# Patient Record
Sex: Male | Born: 1960 | Race: White | Hispanic: No | Smoking: Never smoker
Health system: Southern US, Community
[De-identification: ages and names within clinical notes are randomized; demographics above are authoritative.]

## PROBLEM LIST (undated history)

## (undated) DIAGNOSIS — I10 Essential (primary) hypertension: Secondary | ICD-10-CM

## (undated) HISTORY — PX: BACK SURGERY: SHX140

## (undated) HISTORY — PX: OTHER SURGICAL HISTORY: SHX169

---

## 2002-06-06 ENCOUNTER — Encounter: Payer: Self-pay | Admitting: Specialist

## 2002-06-10 ENCOUNTER — Ambulatory Visit (HOSPITAL_COMMUNITY): Admission: RE | Admit: 2002-06-10 | Discharge: 2002-06-10 | Payer: Self-pay | Admitting: Specialist

## 2002-07-11 ENCOUNTER — Encounter (INDEPENDENT_AMBULATORY_CARE_PROVIDER_SITE_OTHER): Payer: Self-pay | Admitting: *Deleted

## 2002-07-11 ENCOUNTER — Ambulatory Visit (HOSPITAL_COMMUNITY): Admission: RE | Admit: 2002-07-11 | Discharge: 2002-07-12 | Payer: Self-pay | Admitting: Specialist

## 2002-07-11 ENCOUNTER — Encounter: Payer: Self-pay | Admitting: Specialist

## 2002-07-16 ENCOUNTER — Emergency Department (HOSPITAL_COMMUNITY): Admission: EM | Admit: 2002-07-16 | Discharge: 2002-07-16 | Payer: Self-pay | Admitting: Emergency Medicine

## 2002-11-07 ENCOUNTER — Encounter: Payer: Self-pay | Admitting: Specialist

## 2002-11-07 ENCOUNTER — Encounter: Admission: RE | Admit: 2002-11-07 | Discharge: 2002-11-07 | Payer: Self-pay | Admitting: Specialist

## 2003-02-12 ENCOUNTER — Inpatient Hospital Stay (HOSPITAL_COMMUNITY): Admission: RE | Admit: 2003-02-12 | Discharge: 2003-02-16 | Payer: Self-pay | Admitting: Specialist

## 2003-02-12 ENCOUNTER — Encounter: Payer: Self-pay | Admitting: Specialist

## 2006-11-02 ENCOUNTER — Encounter: Admission: RE | Admit: 2006-11-02 | Discharge: 2006-11-02 | Payer: Self-pay | Admitting: Specialist

## 2007-01-16 ENCOUNTER — Observation Stay (HOSPITAL_COMMUNITY): Admission: RE | Admit: 2007-01-16 | Discharge: 2007-01-18 | Payer: Self-pay | Admitting: Orthopaedic Surgery

## 2007-05-08 ENCOUNTER — Encounter: Admission: RE | Admit: 2007-05-08 | Discharge: 2007-05-08 | Payer: Self-pay | Admitting: Specialist

## 2007-07-16 ENCOUNTER — Inpatient Hospital Stay (HOSPITAL_COMMUNITY): Admission: RE | Admit: 2007-07-16 | Discharge: 2007-07-19 | Payer: Self-pay | Admitting: Specialist

## 2010-08-12 ENCOUNTER — Encounter
Admission: RE | Admit: 2010-08-12 | Discharge: 2010-08-12 | Payer: Self-pay | Source: Home / Self Care | Attending: Specialist | Admitting: Specialist

## 2010-08-28 ENCOUNTER — Encounter: Payer: Self-pay | Admitting: Specialist

## 2010-09-16 ENCOUNTER — Other Ambulatory Visit (HOSPITAL_COMMUNITY): Payer: Self-pay | Admitting: Specialist

## 2010-09-16 ENCOUNTER — Ambulatory Visit (HOSPITAL_COMMUNITY)
Admission: RE | Admit: 2010-09-16 | Discharge: 2010-09-16 | Disposition: A | Discharge status: Home / Self Care | Payer: Medicare Other | Source: Ambulatory Visit | Attending: Specialist | Admitting: Specialist

## 2010-09-16 ENCOUNTER — Encounter (HOSPITAL_COMMUNITY)
Admission: RE | Admit: 2010-09-16 | Discharge: 2010-09-16 | Disposition: A | Discharge status: Home / Self Care | Payer: Medicare Other | Source: Ambulatory Visit | Attending: Specialist | Admitting: Specialist

## 2010-09-16 DIAGNOSIS — Z01818 Encounter for other preprocedural examination: Secondary | ICD-10-CM | POA: Insufficient documentation

## 2010-09-16 DIAGNOSIS — I10 Essential (primary) hypertension: Secondary | ICD-10-CM | POA: Insufficient documentation

## 2010-09-16 DIAGNOSIS — IMO0002 Reserved for concepts with insufficient information to code with codable children: Secondary | ICD-10-CM

## 2010-09-16 DIAGNOSIS — M5137 Other intervertebral disc degeneration, lumbosacral region: Secondary | ICD-10-CM | POA: Insufficient documentation

## 2010-09-16 DIAGNOSIS — M51379 Other intervertebral disc degeneration, lumbosacral region without mention of lumbar back pain or lower extremity pain: Secondary | ICD-10-CM | POA: Insufficient documentation

## 2010-09-16 LAB — COMPREHENSIVE METABOLIC PANEL
ALT: 32 U/L (ref 0–53)
Albumin: 4.1 g/dL (ref 3.5–5.2)
BUN: 17 mg/dL (ref 6–23)
CO2: 26 mEq/L (ref 19–32)
Chloride: 96 mEq/L (ref 96–112)
Creatinine, Ser: 1.26 mg/dL (ref 0.4–1.5)
GFR calc Af Amer: >60 mL/min (ref >60)
GFR calc non Af Amer: >60 mL/min (ref >60)
Glucose, Bld: 100 mg/dL — ABNORMAL HIGH (ref 70–99)
Sodium: 128 mEq/L — ABNORMAL LOW (ref 135–145)
Total Protein: 7.1 g/dL (ref 6.0–8.3)

## 2010-09-16 LAB — SURGICAL PCR SCREEN: Staphylococcus aureus: NEGATIVE

## 2010-09-16 LAB — DIFFERENTIAL
Basophils Relative: 1 % (ref 0–1)
Lymphocytes Relative: 19 % (ref 12–46)
Lymphs Abs: 1.8 10*3/uL (ref 0.7–4.0)
Neutrophils Relative %: 70 % (ref 43–77)

## 2010-09-16 LAB — TYPE AND SCREEN
ABO/RH(D): B POS
Antibody Screen: NEGATIVE

## 2010-09-16 LAB — PROTIME-INR: Prothrombin Time: 13.2 seconds (ref 11.6–15.2)

## 2010-09-16 LAB — CBC: HCT: 45.8 % (ref 39.0–52.0)

## 2010-09-16 LAB — URINALYSIS, ROUTINE W REFLEX MICROSCOPIC
Hgb urine dipstick: NEGATIVE
Ketones, ur: NEGATIVE mg/dL
Nitrite: NEGATIVE
Protein, ur: NEGATIVE mg/dL
Urine Glucose, Fasting: NEGATIVE mg/dL

## 2010-09-16 LAB — ABO/RH: ABO/RH(D): B POS

## 2010-09-20 ENCOUNTER — Inpatient Hospital Stay (HOSPITAL_COMMUNITY): Payer: Medicare Other

## 2010-09-20 ENCOUNTER — Inpatient Hospital Stay (HOSPITAL_COMMUNITY)
Admission: RE | Admit: 2010-09-20 | Discharge: 2010-09-25 | DRG: 460 | Disposition: A | Discharge status: Home Health | Payer: Medicare Other | Source: Ambulatory Visit | Attending: Specialist | Admitting: Specialist

## 2010-09-20 DIAGNOSIS — I10 Essential (primary) hypertension: Secondary | ICD-10-CM | POA: Diagnosis present

## 2010-09-20 DIAGNOSIS — Q762 Congenital spondylolisthesis: Secondary | ICD-10-CM

## 2010-09-20 DIAGNOSIS — Y9210 Unspecified residential institution as the place of occurrence of the external cause: Secondary | ICD-10-CM | POA: Diagnosis not present

## 2010-09-20 DIAGNOSIS — K219 Gastro-esophageal reflux disease without esophagitis: Secondary | ICD-10-CM | POA: Diagnosis present

## 2010-09-20 DIAGNOSIS — Z23 Encounter for immunization: Secondary | ICD-10-CM

## 2010-09-20 DIAGNOSIS — E871 Hypo-osmolality and hyponatremia: Secondary | ICD-10-CM | POA: Diagnosis present

## 2010-09-20 DIAGNOSIS — Z981 Arthrodesis status: Secondary | ICD-10-CM

## 2010-09-20 DIAGNOSIS — M5137 Other intervertebral disc degeneration, lumbosacral region: Principal | ICD-10-CM | POA: Diagnosis present

## 2010-09-20 DIAGNOSIS — G9741 Accidental puncture or laceration of dura during a procedure: Secondary | ICD-10-CM | POA: Diagnosis not present

## 2010-09-20 DIAGNOSIS — Z79899 Other long term (current) drug therapy: Secondary | ICD-10-CM

## 2010-09-20 DIAGNOSIS — M51379 Other intervertebral disc degeneration, lumbosacral region without mention of lumbar back pain or lower extremity pain: Principal | ICD-10-CM | POA: Diagnosis present

## 2010-09-20 DIAGNOSIS — M5126 Other intervertebral disc displacement, lumbar region: Secondary | ICD-10-CM | POA: Diagnosis present

## 2010-09-20 DIAGNOSIS — IMO0002 Reserved for concepts with insufficient information to code with codable children: Secondary | ICD-10-CM | POA: Diagnosis not present

## 2010-09-21 LAB — BASIC METABOLIC PANEL
Creatinine, Ser: 1.26 mg/dL (ref 0.4–1.5)
GFR calc non Af Amer: >60 mL/min (ref >60)
Glucose, Bld: 115 mg/dL — ABNORMAL HIGH (ref 70–99)
Potassium: 4.6 mEq/L (ref 3.5–5.1)

## 2010-09-21 LAB — HEMOGLOBIN AND HEMATOCRIT, BLOOD
HCT: 35.9 % — ABNORMAL LOW (ref 39.0–52.0)
Hemoglobin: 12.4 g/dL — ABNORMAL LOW (ref 13.0–17.0)

## 2010-09-22 LAB — BASIC METABOLIC PANEL
CO2: 27 mEq/L (ref 19–32)
Chloride: 99 mEq/L (ref 96–112)
GFR calc non Af Amer: >60 mL/min (ref >60)
Glucose, Bld: 103 mg/dL — ABNORMAL HIGH (ref 70–99)

## 2010-09-22 LAB — POCT I-STAT 4, (NA,K, GLUC, HGB,HCT)
Hemoglobin: 13.9 g/dL (ref 13.0–17.0)
Sodium: 129 mEq/L — ABNORMAL LOW (ref 135–145)

## 2010-10-04 NOTE — Op Note (Signed)
[name], [name]                 ACCOUNT NO.:  [id number]  MEDICAL RECORD NO.:  [id number]           PATIENT TYPE:  O  LOCATION:  XRAY                         FACILITY:  [facility]  PHYSICIAN:  Kerrin Champagne, M.D.   DATE OF BIRTH:  1961/05/07  DATE OF PROCEDURE:  09/20/2010 DATE OF DISCHARGE:  09/16/2010                              OPERATIVE REPORT   PREOPERATIVE DIAGNOSIS:  Central herniated nucleus pulposus, L3-4 with degenerative disk disease at L3-4 above previous L4-S1 fusion.  POSTOPERATIVE DIAGNOSES: 1. 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 2. Degenerative disk disease at bilateral L3 nerve roots and bilateral     L4 nerve roots. 3. Dural tear, right L3-4.  This is at the midline, to the right of     midline, 3 mm x 3 mm.  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  PROCEDURES: 1. Central laminectomy, L3-4. 2. Bilateral L3 and bilateral L4 nerve root decompression and     foraminotomies. 3. Right-sided L3-4 dural repair using 4-0 Nurolon sutures and     DuraSeal. 4. Right transforaminal lumbar interbody fusion utilizing a 9-mm     Concorde lordotic cage with local bone graft. 5. Posterior lateral fusion, L3-4, utilizing local bone graft. 6. Internal fixation, L3-4, utilizing the DePuy polyaxial screws and     rods and pedicle screws and rods.  This was at the L4 level.  SURGEON:  Kerrin Champagne, M.D.  ASSISTANT:  Maud Deed, PA-C.  ANESTHESIA:  General via orotracheal intubation, Dr. Chaney Malling. Supplemented with local infiltration with Marcaine 0.5% with 1:200,000 epinephrine, 20 mL.  FINDINGS:  Significant for severe foraminal stenosis, bilateral L3 nerve roots and bilateral L4 nerve roots due to hypertrophic ligamentum flavum pressing against these nerves.  There was scar from previous lumbar surgeries, L4 sacrum; previous lumbar fusions and central laminectomy. The patient had very large epidural veins present.  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  This too contributed to scar tissue over the area of the pars mid-laminar site.  SPECIMENS:  None.  ESTIMATED BLOOD LOSS:  550 mL.  COMPLICATIONS:  Dural tear, right side L3-4, 3 mm x 3 mm tear.  DRAINS:  Foley to straight drain.  Subcutaneous Hemovac, right lumbar.  BRIEF CLINICAL HISTORY:  The patient is a 50 year old male.  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  He persisted with pain and discomfort and underwent a revision to a lumbar fusion from L4 to sacrum in 2004, since then, he has done fine in regards to his lumbar spine.  Developed symptoms and signs consistent with cervical stenosis.  He underwent an open trapdoor laminoplasty, cervical spine, from C3-C7 in 2008 and tolerated this procedure well.  He returned to the operating room 2 years ago, underwent removal of hardware at the L4 sacral level for a solid fusion.  He had fracture to the sacral screws at the S1 level. Intraoperatively, these screws were removed as well as the broken portions  of the S1 screws.  Examination of the patient's fusion showed it to be solid and well healed.  The patient has been developing progressive and increasing difficulties with standing and ambulation and pain into both lower extremities.  He has undergone extensive evaluation, and his studies have demonstrated the disk protrusion at L3- 4 above his previous L4 to sacrum fusion, degenerative disk changes and mild stenosis.  Persistent pain and discomfort, use of strong narcotic medication, so it was felt that the patient was a candidate to undergo intervention to diminish his discomfort and improve his overall function.  He was brought to the operating room to undergo decompression at the L4-5 level with extension of fusion from the 4 to S1 level up to the L3-4 level.  Intraoperative findings as  above.  DESCRIPTION OF PROCEDURE:  Note, the patient, prior to general anesthesia, was in the preoperative holding area and signed informed consent.  All questions were answered.  Received standard preoperative antibiotic of Ancef.  He was taken to the operating room.  OR room 15 was used for the case.  Here, he underwent induction of general anesthesia.  A Foley catheter was placed.  He was then turned to a prone position, and the [place] spine frame was used.  All pressure points well padded.  Care taken to ensure that the abdomen, chest, and the chest base were well padded as well.  PAS hose was used.  Standard prep with DuraPrep solution, draped in the usual manner, iodine Y-drape was used.  Standard time-out protocol identifying the patient, procedure, expected length of time, and estimated blood loss.  Cell Saver was used during the case.  The patient had initial incision made with extension of the incision superiorly to the L1 and L2 levels, ellipsing the old incision scar over the upper aspect of L4 and L5.  Incision carried through skin and subcutaneous layers directly down to the spinous processes of L2, L3, and L1.  Incision carried on both sides of the spinous process of L1, L2, and L3 and along the posterior elements. Cobb elevators used to elevate muscles bilaterally.  Kocher clamp placed on either side of spinous process of L3 and intraoperative radiograph obtained using C-arm fluoro, this demonstrated lower clamp on the L3 spinous process and the upper clamp at the L2-3 interspinous ligament posteriorly.  Continued exposure was then performed, exposing posterior and lateral fusion areas at the L5 and L4 levels bilaterally using curved osteotome as well as Cobb elevators and over the posterior aspect of the facet at the L3-4 and L4-5 levels, preserving the facet capsule at the L2-3 level and at the L1-2 level.  Obtaining enough lateral exposure to allow for insertion of  pedicle screws at the correct degree of convergence.  Bleeders controlled using monopolar and bipolar electrocautery.  The transverse process at the level of the L2-3 facet joint, which represented the L3 transverse process was localized on both sides and exposed as was the posterolateral fusion mass at the transverse process of L4 bilaterally.  Bleeders again controlled using bipolar and monopolar electrocautery.  Posterolateral gutters were then carefully packed and Leksell rongeur then used to remove the spinous process of L3, then the central portions in the lamina of L3.  Anterior aspect of the spinous process of L2, about 50% was resected as well. Posterior aspect of the interlaminar space was carefully debrided of muscle attachments and bleeding controlled using bipolar electrocautery again.  Loupe magnification headlamp used during this portion of  procedure and osteotome then used to resect the medial aspect of the L3- 4 facet, approximately 50% was resected, first on the left side, then on the right side.  Osteotome used similarly in the central portions of the lamina of L3.  Kerrison then introduced to resect central portions of lamina.  Cottonoid placed beneath the neural arch during resection centrally, and this was continued cranially to the L2-3 level.  At this point, osteotomes were then used to resect portions of the medial aspect of the L3 superior articular process and portion of the superior aspect of the lamina at this level, performing lateral recess decompression at the L2-3 level and resecting ligamentum flavum at the L2-3 level.  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 Thickened ligament was carefully freed up on the right side, and a 3-mm Kerrison was used to first free the ligamentum flavum and curette, then applied to the medial aspect of the L3-4 facet.  This was when the dural tear  occurred.  There were elements found to be herniated through the dural tear, and these were carefully kept intact.  Cottonoids applied.  Irrigation performed.  An osteotome was used to further resect bone, then along the medial aspect of the L3-4 facet in order to decompress and remove another facet and ligamentum flavum so we were lateral to the dural tear.  Foramen of the L4 nerve root was carefully decompressed using 3 mm and 2 mm Kerrison, so much of the hockey stick neural probe could then be passed out the L4 neural foramen on the right side.  I passed along the medial aspect of the L4 pedicle and the neural foramen for the L3 nerve root identified. A hockey stick neural probe passed out this neural foramen, and the retracted portions of ligamentum flavum found to be quite hypertrophic were resected protecting the nerve root with the hockey stick neural probe.  This was continued superiorly.  The patient was found to have evidence of bilateral spondylolysis at the three level present.  The mobile inferior articular process of L3 was resected, first on the right side, then on the left side.  With the reflected portions of ligamentum flavum and medial aspect of L3-4 facet then resected, the dural tear was carefully evaluated.  Some significant bleeding from epidural veins along the right side, along the lateral aspect of the L4 nerve root as it passed the medial border of the L4 pedicle and also along the axillary area of the L3 nerve root.  Careful evaluation of the dural tear on the right side demonstrated a flap-type tear with base caudal, measuring about 3 x 3 mm.  A stay suture was then first placed in the central base of the flap, and then this was used for tethering of the thecal sac over the left side of the tear and caudal aspect.  A second suture was then placed distal, tagging the flap distally as well and then allowing for the nerve roots to carefully relax into the thecal  sac area and this was without difficulty.  Next, additional 4-0 Nurolon tacking sutures were then placed circumferentially about the flap, first over the left side, then over the right side, tacking this down at 0.5 mm increments or less.  With this then, on Valsalva, there was no sign of dural leak at this point.  Attention was then turned to further decompression on the left side of the ligamentum flavum.  The  medial aspect of the L3-4 facet was carefully resected again, patient noted to have mobile inferior articular process at L3 secondary to spondylosis, so this was resected. Osteotome then used to resect medial 20% to 25% of the superior articular process of L4, and then, this was removed using 2-mm Kerrisons.  This allowed for resection also of the ligamentum flavum attached here.  The medial aspect of the superior articular process of the L4 was undercut using 3-mm Kerrisons.  Foraminotomy performed of the left L3 nerve root, first identifying the nerve root at the 2-3 level and resecting over the nerve root out into the L3 neural foramen.  With this done, a hockey stick neural probe could be passed out the L4 and L3 neural foramens.  Some residual portions of the medial aspect of the L4 superior articular process were found to be present and compressing on the L4 root along with ligamentum flavum at the entry point to its neural foramen, this was resected.  The hockey stick neural probe, as noted, could be passed out over the L4 and L3 neural foramen.  Bleeding controlled using bipolar electrocautery as well as thrombin-soaked Gelfoam and cottonoids on the left side.  Attention was then turned to the performance of stabilization, and C-arm fluoro was brought until the awl was placed over the lateral aspect of the pedicle at L3, at the intersection of the transverse process with the lateral aspect of the pedicle.  Awl noted on C-arm fluoro to be in excellent position alignment.   A pedicle probe used to probe the pedicle on the left side to a depth of 40 mm, observed on C-arm fluoro to be in good position alignment.  Ball-tipped probe then used to check the patency of the opening into the pedicle, ensuring no broaching cortex.  The patient then had tapping performed, using 6.0 tap.  A 7 mm x 40 mm screw was chosen.  Decortication of the transverse process carried out, again checking the tap opening into the pedicle and ensure no broaching of cortex, and the screw was inserted, screwed into place after first placing bone graft that had been harvested locally from the central laminectomy over the left side following decortication of the transverse process of L3.  Awl was then placed on the left side at the L3 level and identifying the pedicle within the laminotomy region.  This then directed with convergence.  The awl observed on C-arm fluoro to be in good position alignment. Pedicle probe then used to probe the pedicle at the L4 level to a depth of 40 mm, and a 40 mm x 7 mm screw was chosen and tapped with a 6 mm tap, checking with a ball-tipped probe to ensure no broaching of cortex with either the initial pedicle probing or the second tapping.  Decortication of transverse process with a high-speed bur then carried out.  Bone graft applied to the transverse process of left side, extending from the transverse process of L4 and the posterolateral fusion mass up to the L3 level.  Both screws and fasteners carefully loosened.  A 45-mm rod then placed on the left side. Caps were temporarily placed and left loosened for later tightening. Attention then turned to the right side where again awl used to make an entry point on the right side at the L3 level at the intersection of transverse process with the lateral aspect of the pedicle at L3.  This was done without difficulty.  Observed on C-arm fluoro to be  in good position alignment and a pedicle probe then used to probe  pedicle at L3 on the right side.  Venous bleeding encountered.  Bleeders controlled using bipolar and monopolar electrocautery.  The patient had tapping performed and checking with a ball-tipped probe noted some lateral penetration of the lateral cortex at the L3 level, so the screw was redirected using first the pedicle probe to reinsert and determine the alignment of the pedicle.  Then, a ball-tipped probe in order to probe the pedicle to ensure patency without broaching of cortex.  Tapping performed using a 6.0-mm tap on the right side, and ball-tipped probe again used to probe the pedicle channel to determine the correct degree of convergence.  A 40 mm x 7 mm screw was placed at the L3 level on the right side after decortication of the transverse process and placement of local bone graft that had been morselized here.  Attention was then turned to the right side at the L4 level where similarly an awl used to identify the pedicle on the C-arm fluoro, correct degree of convergence, and a pedicle probe then used to probe pedicle at the L4 level and correct degree of convergence to about 40 mm.  A 45 mm screw was chosen as the previous screw on the opposite side was felt to be slightly short.  Following tapping, checked with a ball-tipped probe and no evidence of broaching of cortex or pedicle at the L4 level.  A 6-mm tap was used to tap, again checking with the ball-tipped probe to ensure no broaching of cortex, none was present.  Decortication carried out of posterolateral fusion mass at the L4 level.  Additional bone graft applied from this mass to the transverse process of L3.  A 45 mm x 7 mm screw was then placed on the right side at the L5 level and obtained excellent purchase.  Both fasteners at the L3 and L4 level on the right side were then carefully loosened.  A 45 mm rod was inserted and temporary fixation caps placed and they were left loosened.  Attention then turned to TLIF  on the right side.  Thecal sac was carefully retracted medially to the L3-4 level.  Bipolar electrocautery used to control venous bleeding in the axillary portion of the L3 nerve root and out into the foramen of the L3 nerve root.  This was completed and hemostasis was obtained.  A 15-blade scalpel used to incise disk on the right side just over the superior aspect of the pedicle at L4.  A window of annulus was resected.  Pituitary rongeurs used to resect bone superficially, and then pituitary rongeurs used to debride intervertebral disk space of degenerative disk material.  A spacer was then inserted to dilate the disk space, first to 8 mm, then 9 and 10 mm. Straight and upbiting pituitaries were then used to further debride the disk space of degenerative disk material, and then curettes were able to be introduced.  Straight curette and right-upbiting left curettes were used to debride the intervertebral disk space of degenerative disk material and cartilaginous endplates down to bleeding bone endplates. Pituitary rongeurs used to further debride the disk space, and ring curettes were used to further debride.  An osteotome was introduced at one point to debride some cartilage debris off the inferior aspect of the inferior endplate of L3.  With this then, the disk space was felt to be adequately debrided of degenerative disk material as well as cartilaginous endplates with the  inferior aspect of L3 and superior aspect of L4.  Sounding was carried out, using first 8 and then 9 mm sounder; 9 mm provided excellent fit and observed on C-arm fluoro to be in good position alignment, filling the disk space adequately with no space superiorly or inferiorly.  A 5-mm lordotic Concorde cage was chosen.  Morselized bone graft was packed within the lordotic Concorde cage at 9 mm using a short 23-mm implant.  Additional morselized bone graft material was placed within the intervertebral disk  space, impacting the graft as I went along at least three times in order to impact the disk space with morselized bone graft.  The 23-mm x 9-mm cage packed with bone grafts was then introduced at about 15 degrees convergence and then impacted into the disk space and subset beneath the posterior aspect of disk space by about 3 or 4 mm, observed on C-arm fluoro to be in good position alignment.  Irrigation was carried out. On careful inspection of the spinal canal, small morselized crevice bone noted to become loose within the canal was carefully removed; and a hockey stick neural probe was then passed out the neural foramen for the L3 and L4 nerve roots bilaterally, demonstrating their persistent patency.  At this point then, DuraSeal was applied to the posterior aspect of the thecal sac on the right side after careful drying of the area.  Additional 4-0 Nurolon suture was necessary over the distal portion of the flap to ensure adequate seal at the end of the case. Valsalva again performed.  No active dural leak was noted.  The DuraSeal was applied, providing further watertight closure.  At this point then, the rods were then attached to the fasteners and the fastener caps tightened down to 80 foot pounds, first on the left side at the L3 level, then on the right side at the L3 level, then right side L4, compressing between the L3 and L4 fasteners, then turning to the left side and compressing between the L3 and L4 fasteners and tightening the L4 fastener to the rod at 80 foot pounds.  This obtained excellent fixation.  Permanent C-arm images obtained in AP and lateral planes, documenting fixation as well as 45-degree obliques assessing the pedicle screws each and determining them to be well placed.  The Viper retractors were then removed.  Careful inspection demonstrated no significant active bleeding present.  Small amount of devitalized paralumbar muscle was debrided centrally, and the  paralumbar muscles were approximated loosely with interrupted #1 Vicryl sutures. Lumbodorsal fascia reapproximated to itself, and the caudal aspect of the incision and superior aspect of the incision were reattached to the superior and interspinous ligaments, extending from L1-L2 at the midline.  Deep subcu layers approximated with interrupted #1 Vicryls, more superficial layers with interrupted 1-0 and 2-0 Vicryl sutures, and skin closed with running subcu stitch of 4-0 Vicryl.  The patient then had Octylseal applied to the skin and then Mepilex bandage.  Note that a medium Hemovac drain was placed in the superficial aspect of the skin superficial to the fascial layer.  The patient then was returned to supine position, reactivated, extubated, and returned to the recovery room in satisfactory condition.  All instrument and sponge counts were correct.  COMPLICATIONS:  The patient had dural tear, right side at the L3-4 level, requiring a suture repair and DuraSeal.  Physician assistant's responsibility:  Maud Deed performed the duties of assistant surgeon during this case.  She performed surgery from the beginning  of the case to the end of the case, was present for positioning of the patient at the end of the case, with application of dressings, and removal of the patient from the table.  She, during the operation, performed careful retraction of neural structures and careful suctioning about the neural structures, operating with loupe magnification and headlamp.  She assisted in the placement of rods and caps for the screws and pedicle screws and rods.  She assisted with tightening of the posterior structures and the compression of the pedicle screws posteriorly to obtain lordosis.  At the end of the case, she applied dressing following closure.  She performed closure from the fascial layer to the skin.     Kerrin Champagne, M.D.     JEN/MEDQ  D:  09/20/2010  T:  09/21/2010   Job:  [id number]  Electronically Signed by Vira Browns M.D. on 10/04/2010 09:22:15 AM

## 2010-10-26 NOTE — Discharge Summary (Signed)
[name], VASEK                 ACCOUNT NO.:  [id number]  MEDICAL RECORD NO.:  [id number]           PATIENT TYPE:  I  LOCATION:  5018                         FACILITY:  [facility]  PHYSICIAN:  Kerrin Champagne, M.D.   DATE OF BIRTH:  01-Jun-1961  DATE OF ADMISSION:  09/20/2010 DATE OF DISCHARGE:  09/25/2010                              DISCHARGE SUMMARY   ADMISSION DIAGNOSES: 1. Herniated nucleus pulposus central at L3-4. 2. Degenerative disk disease L3-4 above lumbar fusion L4 through S1. 3. Hypertension 4. Gastroesophageal reflux disease.  DISCHARGE DIAGNOSES: 1. 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 2. Spondylosis of bilateral L3, dural tear right L3-4 midline and to     the right, 3. Herniated nucleus pulposus central at L3-4. 4. Degenerative disk disease L3-4 above lumbar fusion L4 through S1. 5. Hypertension 6. Gastroesophageal reflux disease. 7. Posthemorrhagic anemia.  PROCEDURE:  On September 20, 2010, the patient underwent central laminectomy L3-4 with bilateral L3 and L4 foraminotomies, right L3-4 dural repair, and right transforaminal lumbar interbody fusion with pedicle screw and rod instrumentation and local bone graft.  This was performed by Dr. Otelia Sergeant under general anesthesia, assisted by Maud Deed PA-C.  CONSULTATIONS:  None.  BRIEF HISTORY:  The patient is a 50 year old male who has had multiple procedures to his lumbar spine.  He is status post lumbar fusion at L4 through S1 which was initially done with instrumentation and then required removal of instrumentation.  He has studies indicating that his fusion is solid at L4 through S1.  He now has studies indicating degenerative disk disease as well as herniated nucleus pulposus at L3-4. It was felt he would benefit from surgical intervention for the adjacent level degenerative disk disease.  He was admitted to undergo the procedure as stated above.  BRIEF HOSPITAL COURSE:  The  patient developed a dural tear intraoperatively.  He required bedrest for 3 days.  During that timeframe, the patient was treated with IV analgesics for his discomfort.  His Foley catheter remained for 3 days and he was on Keflex for empiric coverage of infection.  The patient had slow return of his bowel function and required laxatives and suppositories for bowel movement.  This diet was held until his bowel function returned.  He was placed on Protonix as well as Zofran for the nausea.  Eventually, his bowel function returned and he was able to take a regular diet.  The patient developed mild hyponatremia which was corrected with IV fluid adjustments.  The patient eventually was weaned to p.o. analgesics.  He was fitted with an LSO brace by [employer].  Physical therapy was initiated on the third postoperative day as well as occupational therapy.  He used a walker for ambulation.  Neurovascular and motor function of the lower extremities was intact.  He did report relief of his leg symptoms preoperatively.  Occupational therapy assisted with ADLs, and the patient was independent with ADLs prior to discharge.  The patient's Hemovac drain was discontinued from his wound on the first postoperative day and dressing changes were done daily thereafter.  The patient was  noted to have healing of the wound without signs of infection.  The patient developed peroneal discomfort and tenderness.  He was started on a Medrol Dosepak as this was felt to be related to swelling postoperatively and possibly to his dural tear.  He also complained of severe muscle spasms which required Valium for relief.  At discharge, the patient was ambulating 250 feet.  He was able to don and doff his brace without difficulties.  At discharge, he was afebrile, vital signs were stable.  He was taking a regular diet and voiding without difficulty.  PERTINENT LABORATORY VALUES:  On admission, hemoglobin 13.9 with hematocrit  41.1.  Hemoglobin at lowest value of 11.6 and 32.8. Chemistry studies on September 20, 2010, showed sodium at 129.  With adjustments in IV fluids, this eventually came up to 131.  Remaining values were within normal limits with the exception of one elevated potassium of 5.2 which resolved prior to discharge.  PLAN:  The patient was discharged to his home.  He was given instructions to continue ambulating utilizing his brace and a walker. He will keep his incision dry until September 25, 2010, and at that point will be allowed to shower.  Dressing changes will be done daily or as needed.  He will increase his activity slowly and avoid bending, lifting, or twisting.  He will continue on a regular diet.  Office visit with Dr. Otelia Sergeant in 2 weeks from the date of surgery.  MEDICATIONS AT DISCHARGE:  The patient was given prescriptions for Valium 2 mg one every 6 hours as needed for spasm, Maalox suspension, and MiraLax over-the-counter.  He will complete his Medrol Dosepak.  He was also given prescriptions for methocarbamol 500 mg one every 6-8 hours as needed for spasm and Percocet 5/325 one to two every 4-6 hours as needed for pain.  He will continue on his hydrochlorothiazide and lisinopril as taken prior to admission.  The patient will call the office if he has questions or concerns prior to his return office visit.  CONDITION ON DISCHARGE:  Stable.     [name], P.A.   ______________________________ Kerrin Champagne, M.D.    SMV/MEDQ  D:  10/13/2010  T:  10/14/2010  Job:  [id number]  Electronically Signed by Dorna Mai. on 10/21/2010 02:04:27 PM Electronically Signed by Vira Browns M.D. on 10/26/2010 11:20:51 PM

## 2010-12-20 NOTE — Op Note (Signed)
NAMESHAQUIL, ALDANA NO.:  [id number]   MEDICAL RECORD NO.:  [id number]          PATIENT TYPE:  INP   LOCATION:  2899                         FACILITY:  [facility]   PHYSICIAN:  Sharolyn Douglas, M.D.        DATE OF BIRTH:  07-Jul-1961   DATE OF PROCEDURE:  01/16/2007  DATE OF DISCHARGE:                               OPERATIVE REPORT   PREOPERATIVE DIAGNOSIS:  Cervical spondylotic myelopathy.   PROCEDURE:  Cervical lamina plasty C3-C7 with [employer]onic plates placed at  each level.   SURGEON:  Sharolyn Douglas, MD   ASSISTANT:  Aura Fey. Lois Huxley BLOOD LOSS:  400 mL.   COMPLICATIONS:  None.   NEEDLE AND SPONGE COUNT:  Correct.   INDICATIONS:  The patient is a pleasant 50 year old male with  progressive neurologic deficit consistent with myelopathy.  His imaging  studies demonstrate multilevel cervical spinal stenosis with flattening  of the cervical cord at multiple levels.  He now presents for expansile  cervical lamina plasty to  decompress his spinal canal and cord.  Risks, benefits, and alternatives  were reviewed and he has elected to proceed.   PROCEDURE:  After informed consent he was taken to the operating room.  He underwent general endotracheal anesthesia without difficulty and  given prophylactic IV antibiotics.  Neuro monitoring was established in  the form of SSEPs and upper extremity EMGs.  The Mayfield tongs were  placed in the usual sterile fashion.  The patient then carefully turned  prone.  Care was taken to keep his neck in neutral position at all  times.  The Mayfield was then attached to the operating room table using  the attachment arm.  All bony prominences were carefully padded.  Face  and eyes were protected at all times.  Neck was prepped and draped usual  sterile fashion.   A midline incision was made from C3 down to T1.  Dissection was carried  sharply through the nuchal ligament.  Subperiosteal exposure carried out  over the  lateral masses of C3 down to C7.  Intraoperative x-ray was  taken to confirm the levels.  Deep retractors were placed.  Once the  bony landmarks were established a 4 mm high speed bur was used to create  a trough on the left side at the lamina facette junction.  Care was  taken not to completely crack through on the left side.  On the right  side the trough was completed.  There was significant epidural bleeding  and this was controlled using FloSeal and Gelfoam.   We then carefully opened the lamina plasty by applying pressure on the  spinous process and using an angled curette underneath the lamina.  By  gently and sequentially applying pressure the lamina plasty was opened.  We then placed micro lamina plasty plates at each level of C3-C7 which  measured 12 mm.  We placed the small screws using the drill.  The bone  quality was good and the screw purchase was excellent.  We took an  intraoperative x-ray and could visualize  the plate at C3, C4 and C5  which appeared well positioned.  Hemostasis was again achieved using  bipolar FloSeal and Gelfoam.   At the completion of the procedure the epidural bleeding had stopped.  We then placed a deep Hemovac drain and closed the wound in layers using  multiple #1 interrupted Vicryl sutures on the deep fascia, 0 Vicryl and  2-0 Vicryl in the subcutaneous layer, a running 3-0 subcuticular Vicryl  on skin edges and Dermabond.  Sterile dressing was applied.  The patient  was turned supine, extubated without difficulty and transferred to  recovery in stable condition.  There were no deleterious changes in the  SSEPs or EMGs throughout procedure.  It should be noted my assistant Orlin Hilding, PA was present throughout the procedure.  She assisted with the  positioning, the exposure by using the Cobbs and using suction.  She  then assisted me with the lamina plasty by retracting the soft tissues  and providing directed suction.  She assisted with wound  closure.      Sharolyn Douglas, M.D.  Electronically Signed     MC/MEDQ  D:  01/16/2007  T:  01/17/2007  Job:  [id number]

## 2010-12-20 NOTE — Op Note (Signed)
NAMEALIX, STOWERS NO.:  [id number]   MEDICAL RECORD NO.:  [id number]          PATIENT TYPE:  INP   LOCATION:  5028                         FACILITY:  [facility]   PHYSICIAN:  Kerrin Champagne, M.D.   DATE OF BIRTH:  September 23, 1960   DATE OF PROCEDURE:  07/16/2007  DATE OF DISCHARGE:                               OPERATIVE REPORT   PREOPERATIVE DIAGNOSIS:  The patient is a 50 year old male with retained  hardware L4 to S1 broken pedicle screws at the S1 level.  Previous  transforaminal lumbar interbody fusion at both L4-5 and L5-S1.   POSTOPERATIVE DIAGNOSIS:  The patient is a 50 year old male with  retained hardware L4 to S1 broken pedicle screws at the S1 level.  Previous transforaminal lumbar interbody fusion at both L4-5 and L5-S1.   OPERATION PERFORMED:  Removal of DePuy pedicle screws and rods, L4 to  S1, including broken screws bilateral S1.  Optimus bone putty by [employer]  applied to each of the screw removal sites.   SURGEON:  Kerrin Champagne, M.D.   ASSISTANT:  None.   ANESTHESIA:  General via orotracheal intubation, Maren [place], M.D.  supplemented with subcutaneous injection with 0.5% Marcaine with  1:200,000 epinephrine.   FINDINGS:  Broken pedicle screws S1 bilateral difficult to remove.  Screw driver for removal of pedicle screws also stripped during removal  of the hardware making further removal difficult as well.   DISPOSITION OF SPECIMENS:  Pedicle screws and rods were returned to the  patient.   ESTIMATED BLOOD LOSS:  400 mL.   COMPLICATIONS:  The patient's sponge count was poorly performed  intraoperatively.  Incision is widely open and there is no place for  loose sponges to be placed and thorough evaluation of the wound  demonstrated no pockets and no dead space where these sponges could be  missing.  The patient returned to the PACU in good condition.  Intraoperatively the patient did also have a piece of removal of  hardware  equipment break intraoperatively and intraoperative radiographs  demonstrated no sign of very tiny metallic fragment.  It is believed  that the fragment either fell into the room or passed through suction  and was suctioned up into the suction equipment.  Intraoperative  radiographs though did not demonstrate any significant residual metallic  material within the incision or wound.   DESCRIPTION OF PROCEDURE:  After adequate general anesthesia, the  patient had a Foley catheter placed, standard preoperative antibiotics.  He was rolled to a prone position using the [facility] spine table.  All  pressure points well-padded.  PAS stockings applied.  Standard prep with  Duraprep solution from lower dorsal spine to midsacral segments, draped  in the usual manner.  Iodine Vi-drape was used.  The incision after  infiltrating skin and subcu layers down to the spinous process using  0.5% Marcaine with 1:200,000 epinephrine.  Incision ellipsing the old  incision scar extending from L3 to the sacrum.  Incision carried through  the skin and subcutaneous layers down to the lumbodorsal fascia  overlying the spinous processes of  L3 and then L2.  Previous spinous  process L4 and 5 had been resected and care was taken not to enter the  central laminotomy region overlying these areas.  Careful dissection was  then carried out down to the base of the spinous process of L3  bilaterally using Cobb elevators and electrocautery.  Dissection was  then carried out over the lateral aspects over the posterior aspect of  the facet at the L2-3 level and out lateral to the L4 pedicle screw at  the L3 level.  This was L3-4.  The incision was then carried laterally  over each area out dissecting using a 1-1/2 inch curved osteotome  sharply dissecting out to the posterolateral masses and the pedicle  screws at L4, L5 and S1 bilaterally.  This was done without difficulty.  Viper retractor was placed, electrocautery used to  control bleeders.  Each of the patients screws, locking cap on the posterior aspect of the  pedicle screws were then loosened at each segment and then the caps were  each removed.  All six caps delivered without difficulty.   In the process of removing one of the caps, however, the flange on the  removing instrument did break and there was a very small piece of  metallic foreign body that then was produced.  This metallic piece had  broken off a second screw and was clearly visible.  Attempts to remove  it then caused it to flip further into the wound or out of the wound  entirely.  It is believed that suction may very well have removed the  metallic foreign body fragment before it could be further removed.  An  intraoperative radiograph was obtained including radiographs of the  suction equipment which were unable to demonstrate the foreign body  remaining either in the patient or in the suction equipment.  Attempts  at trying to find the material were unsuccessful.  Careful inspection of  the wound and the area where it was felt to be present was unable to  find this.  Irrigation was carried out with copious amounts of irrigant  solution, suctioning the wound completely.   After x-rays were taken, continuation of removal of hardware was  performed after removal of each of the caps then the rods were removed  after debriding the rods using Matt Holmes rongeurs as well as curettage.  The  rods were lifted with Cobb elevator and then removed using rod grippers.   Screws were then began to be removed.  First screw on the left side of  the S1 level was able to be engaged with the screw driver for removal of  the screw and then twisting action this screw was able to be loosened  and removed from the wound at the S1 level without difficulty.  Screw  was then applied to the screw head at the L5 level on the left side.  In  the process of removing the screw, a great deal of squeaking was  present.   It was felt that this represented a tight screw to bone fit.  However, eventually the screw head stripped while trying to remove after  three turns of the screw were able to be performed.  With this, then a  bit of time was spent trying to find a second screw driver which was not  present on the set.  The screw driver which had been used earlier this  day for instrumentation of a separate case on a different case  cart was  not available.  Representative was called and the decision was made to  use the insertion device for the removal of the screws.  This was able  to entered over the top of the screw head and the screw head engaged and  tightened to excellent tightness and then using a vise grip about the  base of the screw inserter, the inserter could be twisted backwards  without unlocking the screw head and causing a loosening of the screw  head insertion device.  With this, the remaining five screws were able  to be removed before a second screw driver was able to be located.  After removal of each of the screws including the two broken posterior  portions of the screw at the S1 level then irrigation was performed.  Examination  of the posterior fusion mass demonstrated to be quite solid  with no motion apparent at L4-5 or L5-S1.  This being the case then  attention was turned to removal of the screws at the S1 segment broken  screws.  The holes for the pedicle screw on the right side was apparent  and a small curette was then used to carefully debride within the hole  down to the tip of the residual remaining deep screw threaded portion.  An overdrill reverse from the [employer] set was then able to be engaged  over the top of the top of the screw after first sizing two previous  screws that were present noting that it could be used to overdrill this.  This was then done to a level of about 6 mm to 7 mm exposing 6 or 7 mm  of the more superficial portion of the screw.  The screw was able  to  then be freed up and loosened.  Attempts were used to use an Easy-out  that has reversed threads to capture the previous threads on the screw  and try to remove it but these had been stripped by prior usage and were  ineffective.  Therefore further freeing up of the screw was carried out  using the curved osteotome provided circumferentially around the screw  loosening it up such that it could be grasped with a tonsil clamp and  then carefully turned and twisted until eventually it could be removed  and this was able to be done without difficulty but did take a great  deal of time in order to eventually remove.  A hole into the pedicle at  the S1 level on the right side measuring about 5 to 6 mm about the screw  with 3 mm on either side was left present.  This amounted to about a 1  cm diameter opening in the pedicle.  On the left side similarly the  screw head was localized using curettage about the posterior aspect of  it and then an overdrill was then placed and overreamer and this was  done to carefully expose portions of posterior aspect of screw and this  was able to be done again and with careful working around the screw  using the curved osteotome again, the screw was eventually able to be  loosened to the point where it could be grasped this time using a needle  driver and then grasping the screw and then twisting and turning until  it was able to be removed.  Examination of the areas where the screws  had been removed demonstrated holes that were present.  None of them  were within the pelvis.  None of them demonstrated any neural elements  or entry into the sacral spinal canal.  Irrigation was carried out over  the posterior aspect of the patient's back.  Optimus [employer]  demineralized bone matrix material was then brought onto the field a  total of 20 mL.  These were used to fill the holes of the sacrum after  careful  irrigation in order to remove any metallic deposits.   Additional bone graft was then applied to each of the lumbar pedicle  screw holes extending from L4 to L5 to the sacrum bilaterally  essentially augmenting previous fusion as well as placing bone graft  within previous pedicle screw holes in case later necessity of  instrumentation would be necessary.  Irrigation was then carried out.  Medium drain placed in the depth of the incision exiting over the right  lower lumbar spine.  The patient's sponge count was noted to be poorly  performed by change of personnel.  They requested a radiograph; however,  the patient's incision was explored and there was no sign of large  sponges that were apparently counted previously but not accounted for.  There was no space within the wound to place such large sponges.  This  completed, then irrigation was carried out with copious amounts of  irrigant solution.  The paralumbar muscles reapproximated midline with  interrupted #1Vicryl sutures.  Lumbodorsal fascia reattached to the  spinous processes at L2, L3 and sacral level using interrupted #1 Vicryl  sutures.  Lumbodorsal fascia reapproximated to itself with interrupted  figure-of-eight simple sutures of #1 Vicryl suture.  Deep subcu layer  was approximated with interrupted #1 0 Vicryl sutures, more superficial  layers with interrupted 2-0 Vicryl sutures.  Skin closed with a running  subcutaneous stitch of 4-0 Vicryl.  Dermabond was then applied.  4 x 4s  fixed to the skin as well as over the drain site using Hypafix tape and  ABD.  Note the pink tape was also used to stabilize the drain at the  skin level.  The patient was then returned to a supine position,  reactivated extubated, returned to recovery room in satisfactory  condition.  All instrument and sponge counts were correct.      Kerrin Champagne, M.D.  Electronically Signed     JEN/MEDQ  D:  07/16/2007  T:  07/17/2007  Job:  [id number]

## 2010-12-23 NOTE — Discharge Summary (Signed)
NAMEZAYDEN, [name]                           ACCOUNT NO.:  [id number]   MEDICAL RECORD NO.:  [id number]                   PATIENT TYPE:  INP   LOCATION:  5025                                 FACILITY:  [facility]   PHYSICIAN:  Kerrin Champagne, M.D.                DATE OF BIRTH:  1961-03-07   DATE OF ADMISSION:  02/12/2003  DATE OF DISCHARGE:  02/16/2003                                 DISCHARGE SUMMARY   ADMISSION DIAGNOSES:  1. 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  2. 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  3. Unilateral spondylolysis at L4-5.  4. Unilateral spondylolysis at L3-4.  5. Status post microdiskectomy right L5-S1 in December of 2003.   DISCHARGE DIAGNOSES:  1. 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  2. 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  3. Unilateral spondylolysis at L4-5.  4. Unilateral spondylolysis at L3-4.  5. Status post microdiskectomy right L5-S1 in December of 2003.  6. Post hemorrhagic anemia, mild, not requiring blood transfusion.  7. Hyponatremia, resolved at discharge.  8. Nausea, resolved at discharge.   PROCEDURE:  On February 14, 2003, the patient underwent central laminectomy and  removal of loose neural arc at the L5 and L4 levels. Decompression of  bilateral L4, L5, and S1 nerve roots.  Right translaminar intralumbar disk  fusion (TLIF) both L4-5 and L5-S1 utilizing [employer] TLIF cages  packed with autogenous right iliac crest bone graft, harvested through a  separate fascial incision. Posterolateral lateral fusion L4 to S1 utilizing  instrumentation from L4 to S1 with Monarch pedicle screws and rods.  Intraoperative pedicle screw monitoring. This was performed by Dr. Otelia Sergeant and  assisted by [name], P.A. under general anesthesia.   CONSULTATIONS:  None.   HISTORY OF PRESENT ILLNESS:  The  patient is a 50 year old white male who  injured his back in the summer of 2003.  He eventually required surgical  intervention for microdiskectomy for disk protrusion at the right L5-S1  level. This was noted to be in an area associated with spondylolisthesis  grade I at the L5-S1 level.  Postoperatively, he had persistent low back  pain with radiation into his lower extremities, primarily related to  standing and walking. Conservative treatment did not give him adequate  management of his pain.  His chronic pain has lead to narcotic medication  use as well as inability to return to a normal lifestyle or his regular  work. 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   HOSPITAL COURSE:  The patient was admitted and underwent the procedure as  stated above under general anesthesia without complications.  Postoperatively, neurovascular motor function of the lower extremities was  noted to be intact. The patient was treated with PCA analgesics initially  and gradually weaned to oral analgesics without difficulty. His Hemovac  drain was discontinued on the first postoperative day without difficulty.  Dressing changes were done daily thereafter and the wound was found to be  healing well. The patient's Foley catheter was discontinued on the first  postoperative day and he was able to void without difficulty.  He received  physical therapy starting on his first postoperative day. He had been fitted  previously with an LSO with leg extension. The physical therapy taught him  donning and doffing of the brace as well as ambulation. He was able to  progress quite well once his pain was under control. The patient developed  hyponatremia at 1:30 on the first postoperative day. He was placed on free   fluid restriction and his hyponatremia improved.  The patient was slow to  have bowel movement, however, after use of laxatives, he was able to have a  normal bowel movement.  HE did develop some nausea on the first  postoperative day, but this was felt to be related to oral analgesics on an  empty stomach. He was monitored for several hours following this and was  able to have regular p.o. intake without further nausea. The patient was  tolerating a regular diet at the time of discharge.  He was afebrile with  vital signs stable and ready for discharge to his home on February 16, 2003.   LABORATORY DATA:  Admission CBC with hemoglobin 13.0, hematocrit 37.2.  Hemoglobin dropped to a lowest value of 10.2 with hematocrit 28.3  postoperatively. The patient did not require blood transfusion.  Preoperatively, the patient had a low sodium at 133.  Prior to discharge,  his sodium was 131.  Urinalysis on admission was negative for urinary tract  infection.  There is no chest x-ray report on the chart at the time of this  dictation and no EKG is available as well.   PLAN:  Arrangements were made for the patient to be discharged to his home.  A rolling walker was made available.  He will be seen by [facility] for spinal fusion protocol which includes physical and occupational  therapy. He will be allowed weightbearing as tolerated and will wear his  brace at all times when he is out of bed. The patient will be able to shower  when his wound is having no drainage. Dressing changes will be done daily.  The patient will continue with fluid restrictions for the next 48 hours. He  is to have no more than 2000 mL of free fluid.  Otherwise, the patient will  continue on a regular diet. He will use over-the-counter stool softeners or  laxatives as needed.  He has been encouraged to use stool softeners daily as long as he is using narcotic analgesics. The patient has been advised to  follow up  with Dr. Otelia Sergeant in 10 to 14 days following surgery and has been  advised to call to arrange the appointment.   DISCHARGE MEDICATIONS:  1. Percocet 5/325 one to two every four to six hours as needed for pain.  2. Robaxin 500 mg one every eight hours as needed for spasm.  3. Trinsicon one p.o. b.i.d.   If he has further questions or concerns prior to his return office visit, he  has been advised to call Dr. Barbaraann Faster office.      [name], P.A.                    Kerrin Champagne, M.D.    SMV/MEDQ  D:  04/15/2003  T:  04/16/2003  Job:  [id number]

## 2010-12-23 NOTE — Op Note (Signed)
[name], [name]                           ACCOUNT NO.:  [id number]   MEDICAL RECORD NO.:  [id number]                   PATIENT TYPE:  EMS   LOCATION:  MAJO                                 FACILITY:  [facility]   PHYSICIAN:  Kerrin Champagne, M.D.                DATE OF BIRTH:  1961-02-02   DATE OF PROCEDURE:  07/17/2002  DATE OF DISCHARGE:                                 OPERATIVE REPORT   PREOPERATIVE DIAGNOSIS:  Herniated nucleus pulposus right L5-S1, grade 1-2  spondylolisthesis L5-S1.   POSTOPERATIVE DIAGNOSIS:  Herniated nucleus pulposus right L5-S1, grade 1-2  spondylolisthesis L5-S1.   OPERATION PERFORMED:  Right-sided microdiskectomy L5-S1.   SURGEON:  Kerrin Champagne, M.D.   ASSISTANT:  [name], P.A.   ANESTHESIA:  General orotracheal anesthesia.   ANESTHESIOLOGIST:  Janetta Hora. Gelene Mink, M.D.   ESTIMATED BLOOD LOSS:  50 cc.   DRAINS:  None.   INDICATIONS FOR PROCEDURE:  The patient is a 50 year old male who sustained  injury while working at a job place.  He has had persistent back pain and  radiation of the right leg since the time of his injury.  He has been  maintained on light duty status since the time of his injury with diminished  symptoms.  Whenever he begins to lift weights greater than 20 to 25 pounds,  pain recurs in his back with radiation to the right leg.  With any  repetitive bending or stooping, again, radiation to the right leg.  The  patient is unable to maintain anything more than a sedentary type lifestyle  with persistent recurring episodes of sciatica in the right lower extremity  and S1 distribution.  He is taking narcotic medication on a constant basis.  He was brought to the operating room to diminish discomfort in the right  lower extremity, diminish the symptoms associated with disk protrusion at  the L5-S1 level causing persistent recurring right S1 radiculopathy.   DESCRIPTION OF PROCEDURE:  After adequate general anesthesia,  patient in  knee chest position on chest rolls, standard preoperative antibiotics.  Standard prep with DuraPrep solution, draped in the usual manner, iodine Vi-  drape.  Two spinal needles placed at the expected L5-S1 level.  An  intraoperative lateral radiograph performed indicating the upper spinal  needle at the L5 spinous process.  The incision made at the upper needle  extending superiorly because of the patient's spondylolisthesis, increase in  lumbosacral angle requiring more upward incision.  Incision approximately 1  to 1-1/2 inch in length through the skin and subcutaneous layers using a 10  blade scalpel after infiltration of Marcaine o0.5% with 1:200,000  epinephrine.  The incision carried down to the lumbodorsal fascia.  Bleeders  controlled using electrocautery.  The lumbodorsal fascia incised along the  right side at the L5-S1 spinous process.  Two Cobbs used to elevate the  paralumbar muscles off  the posterior aspects of the elements of the L5  lamina out to the L5-S1 facet.  A Leksell rongeur was used to debride a  small portion of the inferior aspect of the lamina of the right side of L5.  Then Kerrisons used to debride the ligamentum flavum after releasing its  attachment off the inferior aspect of the lamina of L5 on the right side.  A  complete right hemilaminectomy was performed in order to obtain  visualization of the disk at this particular level.  Due to the patient's  increased lumbosacral angle as well as elongation of this pars, the removal  of the hemi lamina on this right side was necessary.  The patient has known  pars defects.  This was noted during the procedure.  With resection of the  ligamentum flavum then the thecal sac was identified.  The S1 nerve root  retracted medially as well as the thecal sac and the disk examined and found  to be listhesed as felt to be present on plain radiographs.  The disk  protrusion found to be migrating over the posterior  aspect of the inferior  body of L5.  However, at the angle at which this particular patient's  lumbosacral angle was, this required excision of a portion of the lateral  aspect of the disk in order to visualize the disk protrusion below it.  The  operating room microscope was draped and brought into the field under direct  visualization.  Epidural veins were cauterized, retracting the thecal sac  and right S1 nerve root carefully.  With cauterization, adequate  hemostasis  was maintained.  An incision was made in the posterior aspect of the disk on  the right side using  a 15 blade scalpel.  The pituitary was used to excise  the disk in order to be able to obtain a more anterior position of the disk  in the lumbosacral angle.  This allowed [place] to find the disk protrusion  material superior to the disk, however, anterior on the foramen and anterior  on the position of the table.  The disk rupture material was then excised  using pituitary rongeurs.  Examination of the spinal canal demonstrated  there was no further disk material remaining.  It was felt that the disk  material was causing compression upon the right L5 nerve root as it was  exiting out the neural foramen as well as the initial portion of the S1  nerve root.  Following excision of the disk, then irrigation was performed.  There was no significant instability noted at this segment with stressing in  the levels using clamps over the posterior spinous processes as well as  direct pressure.  Irrigation again performed.  Bleeding epidural veins  controlled using bipolar electrocautery.  All thrombin soaked Gelfoam was  then removed at the end of the procedure.  There was no further bleeding  evident.  Soft tissues were then allowed to fall back into place, the  lumbodorsal fascia approximated with interrupted #1 Vicryl sutures.  The  subcu layer was reapproximated with interrupted 0 Vicryl sutures, the more superficial layers with  interrupted 2-0 Vicryl suture and the skin closed  with a running subcu stitch of 4-0 Monocryl.  Tincture of benzoin, Steri-  Strips applied, 4 x 4 fixed to the skin with HypaFix tape.  The patient was  then returned to a supine position, extubated and returned to the recovery  room in satisfactory condition.  Sponge and  instrument counts were correct.   SPECIMENS:  Disk material right L5-S1.   Note that a second intraoperative radiograph was obtained following the  initial radiograph.  This was performed using a clamp over the spinous  process of L5 further identifying this level and a final x-ray obtained with  a hockey stick neural probe out the neural foramen of L5-S1.                                                Kerrin Champagne, M.D.    JEN/MEDQ  D:  07/17/2002  T:  07/17/2002  Job:  [id number]

## 2010-12-23 NOTE — Discharge Summary (Signed)
[name], [name]                 ACCOUNT NO.:  [id number]   MEDICAL RECORD NO.:  [id number]          PATIENT TYPE:  INP   LOCATION:  5028                         FACILITY:  [facility]   PHYSICIAN:  Kerrin Champagne, M.D.   DATE OF BIRTH:  [birth year]   DATE OF ADMISSION:  07/16/2007  DATE OF DISCHARGE:  07/19/2007                               DISCHARGE SUMMARY   ADMISSION DIAGNOSES:  1. Retained hardware L4 to S1 with broken pedicle screws at the S1      level.  Previous transforaminal lumbar interbody fusion at both L4-      5 and L5-S1.  2. Status post cervical laminoplasty.  3. Hypertension.   DISCHARGE DIAGNOSIS:  1. Retained hardware L4 to S1 with broken pedicle screws at the S1      level.  Previous transforaminal lumbar interbody fusion at both L4-      5 and L5-S1.  2. Status post cervical laminoplasty.  3. Hypertension.   PROCEDURE:  On July 16, 2007, the patient underwent removal of DePuy  pedicle screws in rods L4 to S1, including broke and screws bilateral  S1.  Optimus bone putty by [employer] applied to each of the screw removal  sites.  This was performed by Dr. Otelia Sergeant, under general anesthesia.   CONSULTATIONS:  None.   BRIEF HISTORY:  The patient is a 50 year old male with retained hardware  L4 to S1 for fusion.  Fusion was noted to be solid on radiographic  evaluation.  The patient was admitted for removal of the broken  hardware.   BRIEF HOSPITAL COURSE:  The patient tolerated the procedure well.  Neurovascular motor function of lower extremities intact  postoperatively.  Hemovac drain discontinued on the first postoperative  day with daily wound checks and the patient's wound healing well.  The  patient received physical therapy for ambulation and gait training.  Brace was used to the lumbar spine.  He was able to done and doff this  without difficulty.  He did have constipation, treated with oral  laxatives.  On the third postoperative day, the patient was  voiding  well, having bowel movements, eating a regular diet and independent with  activity.  He was felt stable for discharge home at that time.   PERTINENT LABORATORY VALUES:  Admission CBC with hemoglobin and  hematocrit 15.8 an 45.2, respectively.  Prior to discharge, hemoglobin  13, hematocrit 37.6.  Chemistry studies on admission with sodium 130,  otherwise normal values.  EKG on admission with normal sinus rhythm.   PLAN:  The patient was discharged to his home.  He was instructed to  avoid lifting over 10 pounds and ambulate as tolerated.  He will wear  his brace at all times, when out of bed.  The patient may change his  dressing daily.  He will be allowed to shower at discharge.  The patient  to follow up with Dr. Otelia Sergeant 2 weeks from the date of surgery.  He will  resume a regular diet.  Medications at discharge include Percocet 5/325,  1 to 2 every 46  hours as needed for  pain and Robaxin 500 mg one every 8 hours as needed for spasm.  He will  resume his hydrochlorothiazide that he was taking prior to admission, as  his regular dose at home.   CONDITION ON DISCHARGE:  Stable.      [name], P.A.      Kerrin Champagne, M.D.  Electronically Signed    SMV/MEDQ  D:  08/14/2007  T:  08/14/2007  Job:  [id number]
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
## 2011-05-15 LAB — BASIC METABOLIC PANEL
CO2: 24
Calcium: 9.9
GFR calc Af Amer: >60
GFR calc non Af Amer: >60
Potassium: 4.6
Sodium: 130 — ABNORMAL LOW

## 2011-05-15 LAB — CBC
HCT: 45.2
Hemoglobin: 15.8
MCHC: 34.9
RBC: 4.85

## 2011-05-15 LAB — HEMOGLOBIN AND HEMATOCRIT, BLOOD
HCT: 37.6 — ABNORMAL LOW
HCT: 39.8
Hemoglobin: 13
Hemoglobin: 13.7

## 2011-05-15 IMAGING — CR DG CHEST 2V
2 series · 2 of 2 positions shown · non-contrast
Comparison: 07/15/2007

CLINICAL DATA: Preoperative evaluation.  No chest complaints.
History of hypertension.

CHEST - 2 VIEW

[view not recorded (1 of 2)]
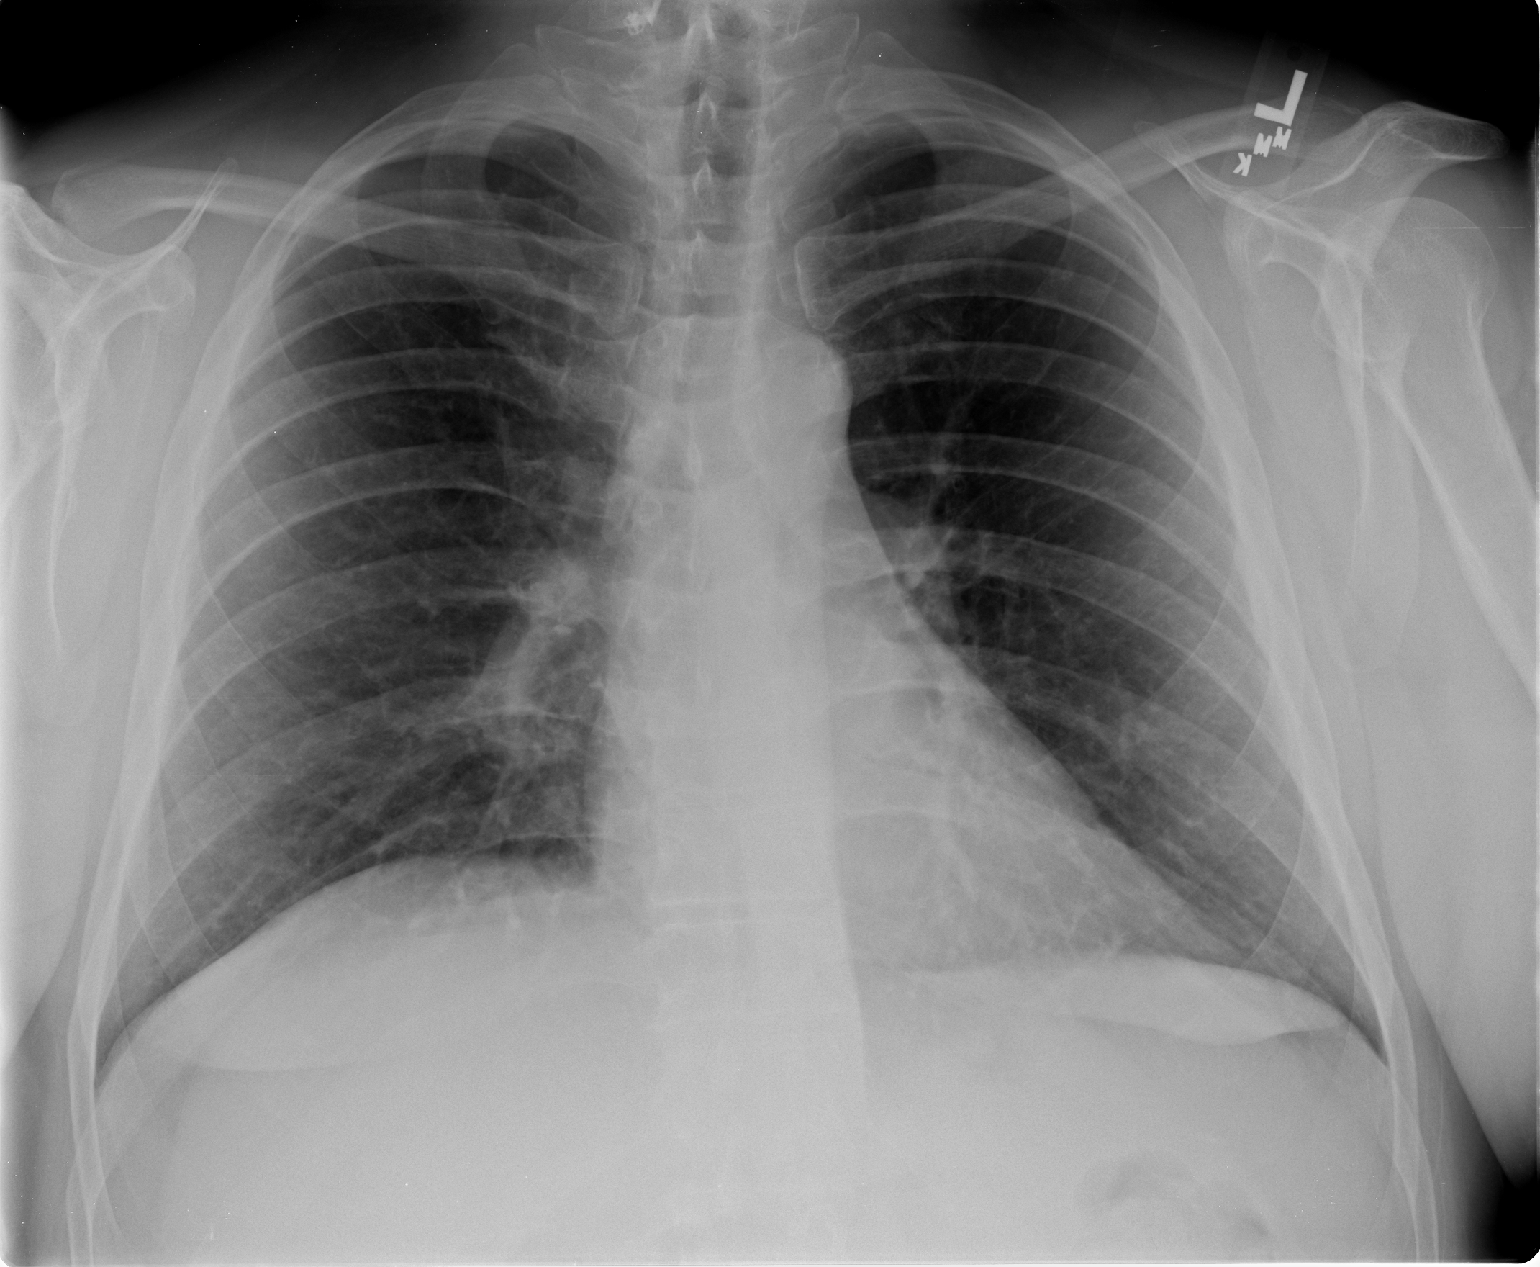

[view not recorded (2 of 2)]
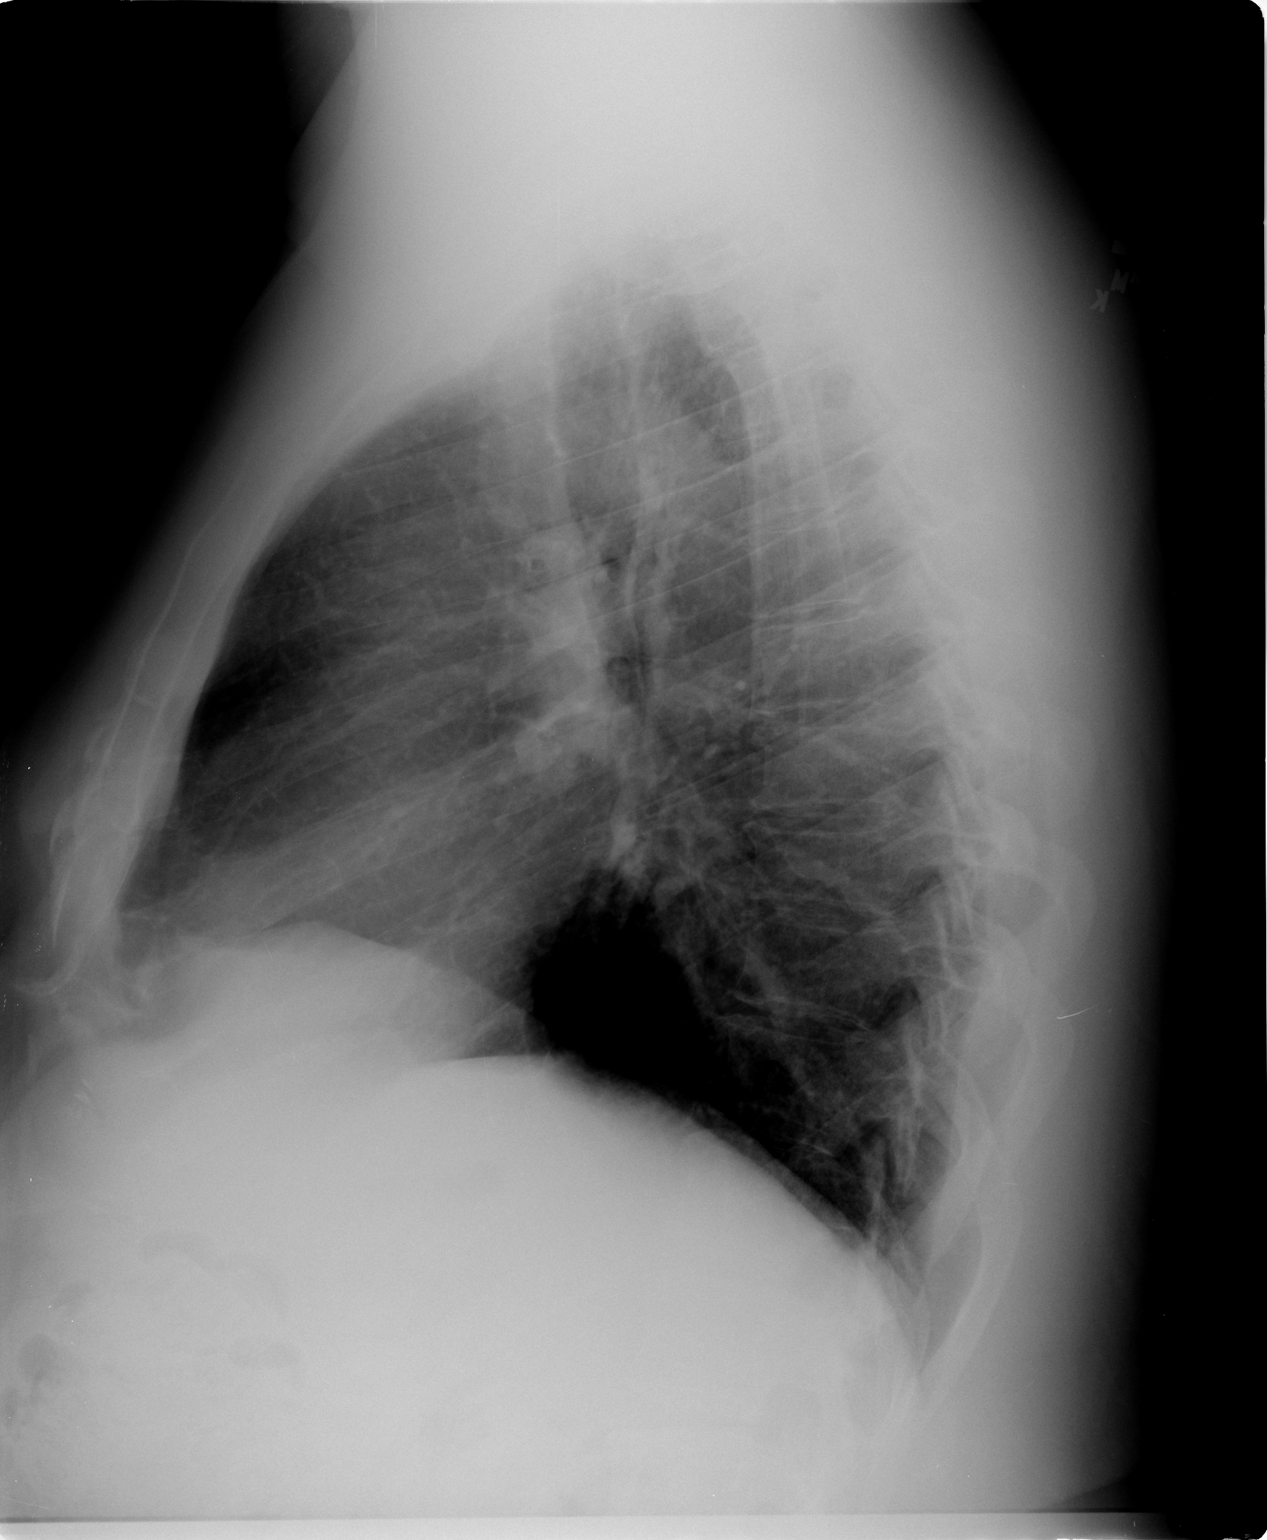

[2 of 2 positions shown; findings below may reference images not displayed]

FINDINGS: Heart and mediastinal contours are within normal limits.
The lung fields are clear with no signs of focal infiltrate or
congestive failure.  No pleural fluid or peribronchial cuffing is
noted.

Bone screws are identified in the right aspect of the C8 vertebral
body.  Bony structures are otherwise intact.
IMPRESSION: Stable cardiopulmonary appearance with no new focal or acute
abnormality identified.

## 2011-05-19 IMAGING — RF DG LUMBAR SPINE 2-3V
1 series · 4 of 4 positions shown · non-contrast
Comparison: 07/16/2007 and 08/12/2010

CLINICAL DATA: L3-L4 fusion.

LUMBAR SPINE - 2-3 VIEW

[Series 1: run · 4 of 4 slices shown]
[im 1/4]
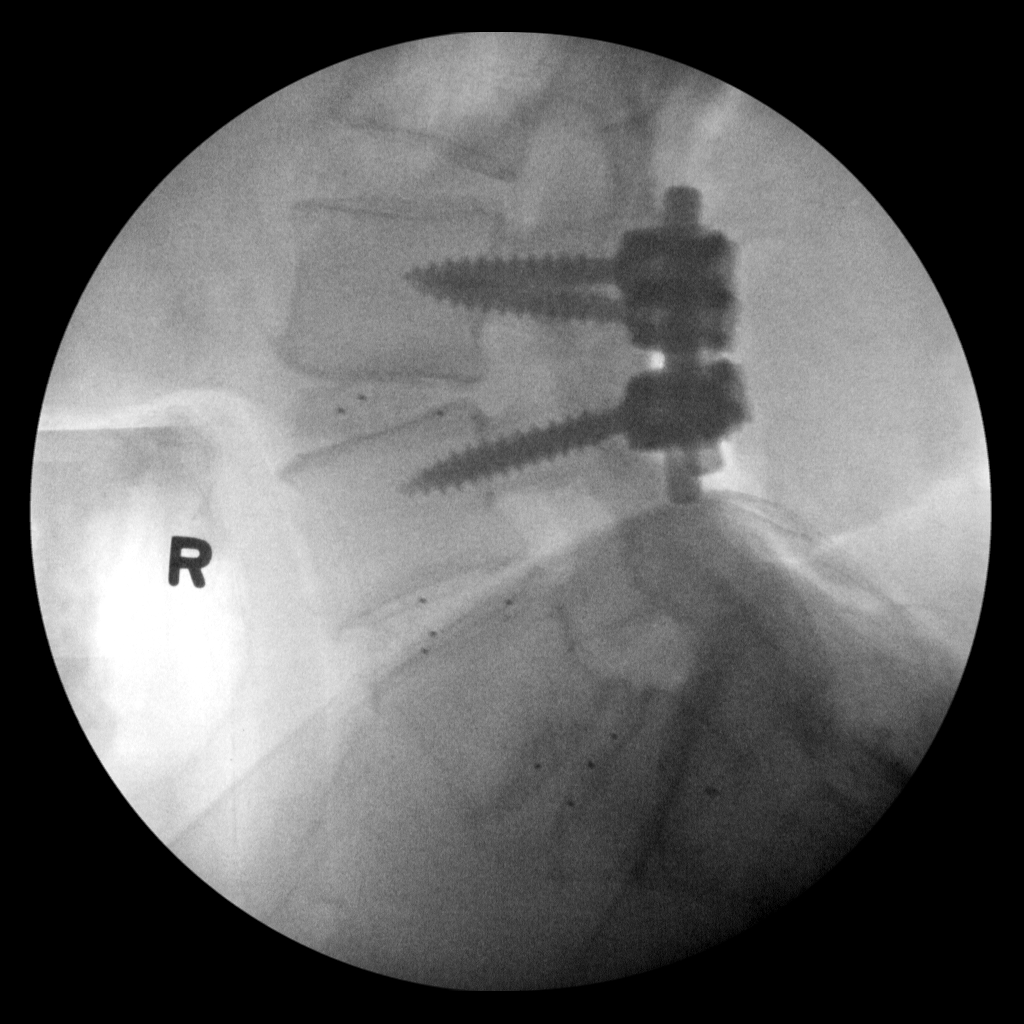
[im 2/4]
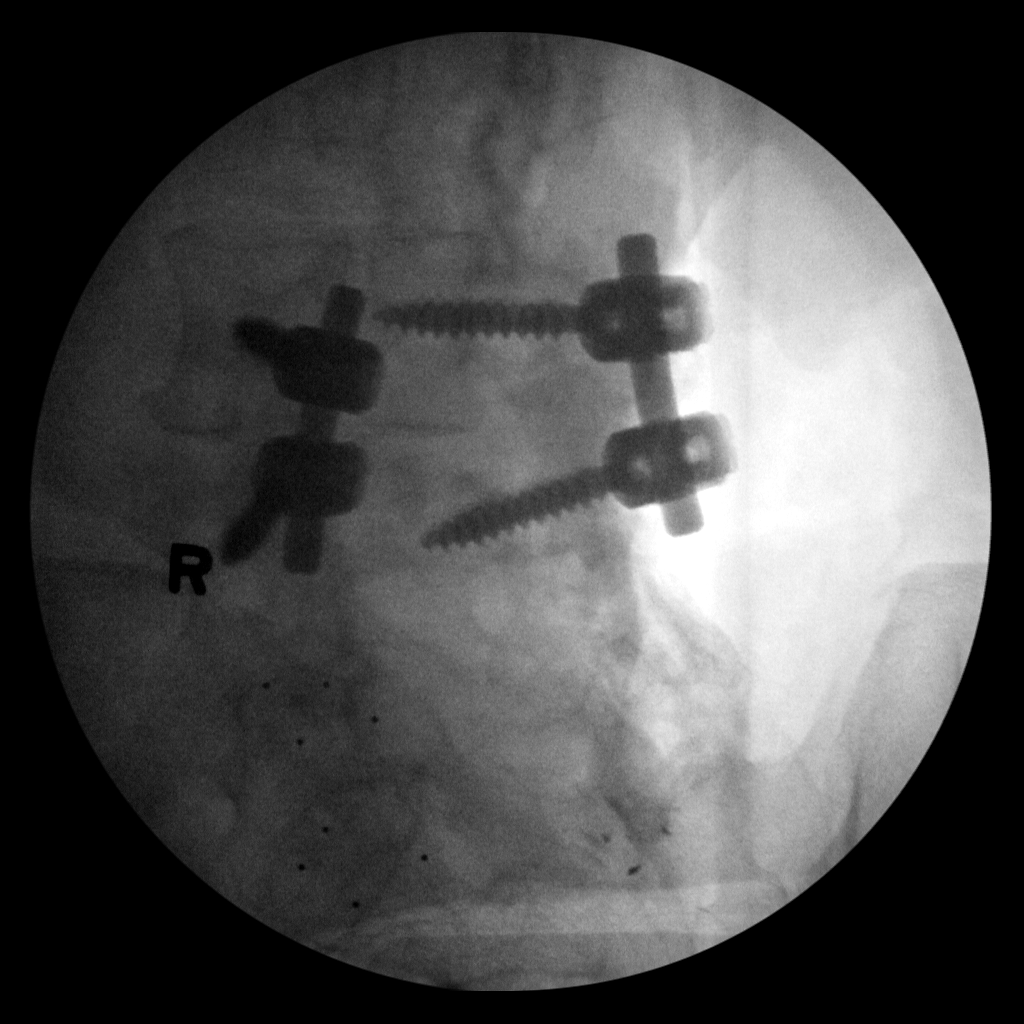
[im 3/4]
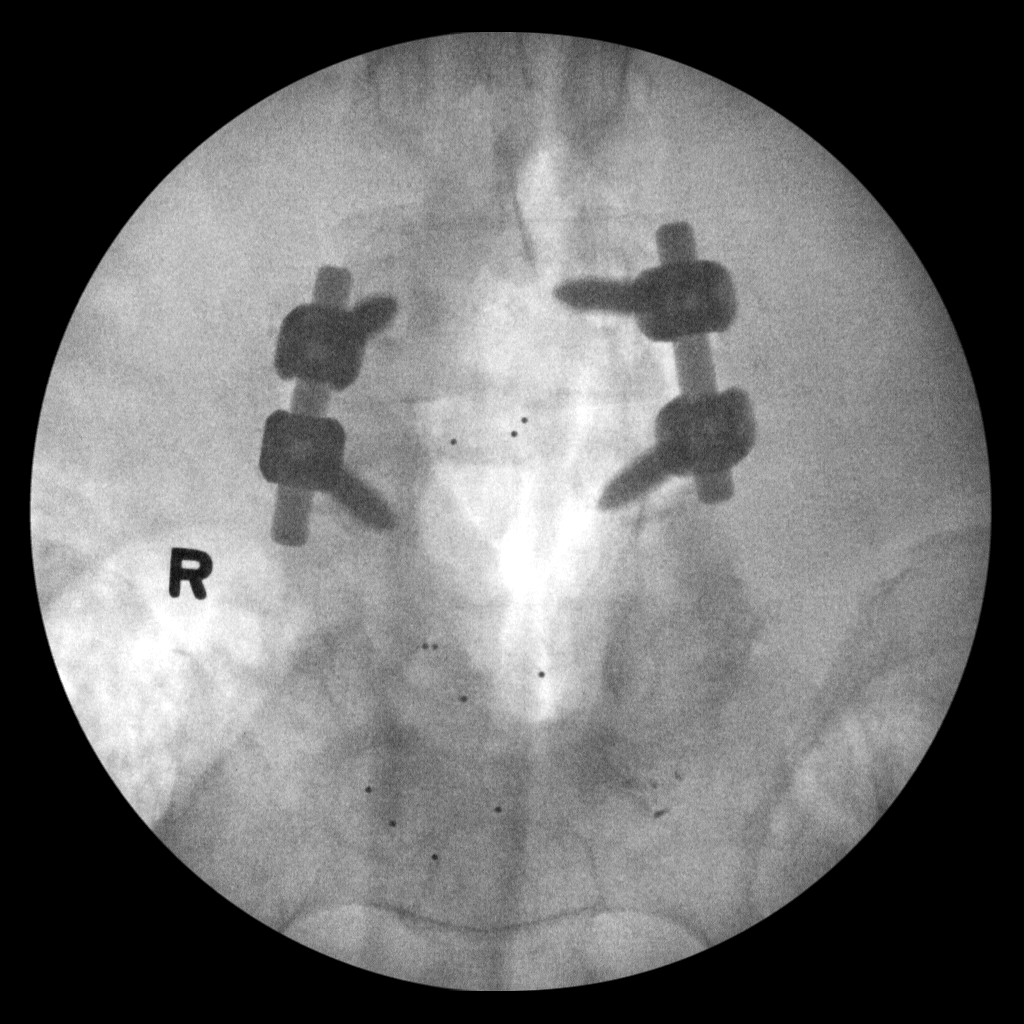
[im 4/4]
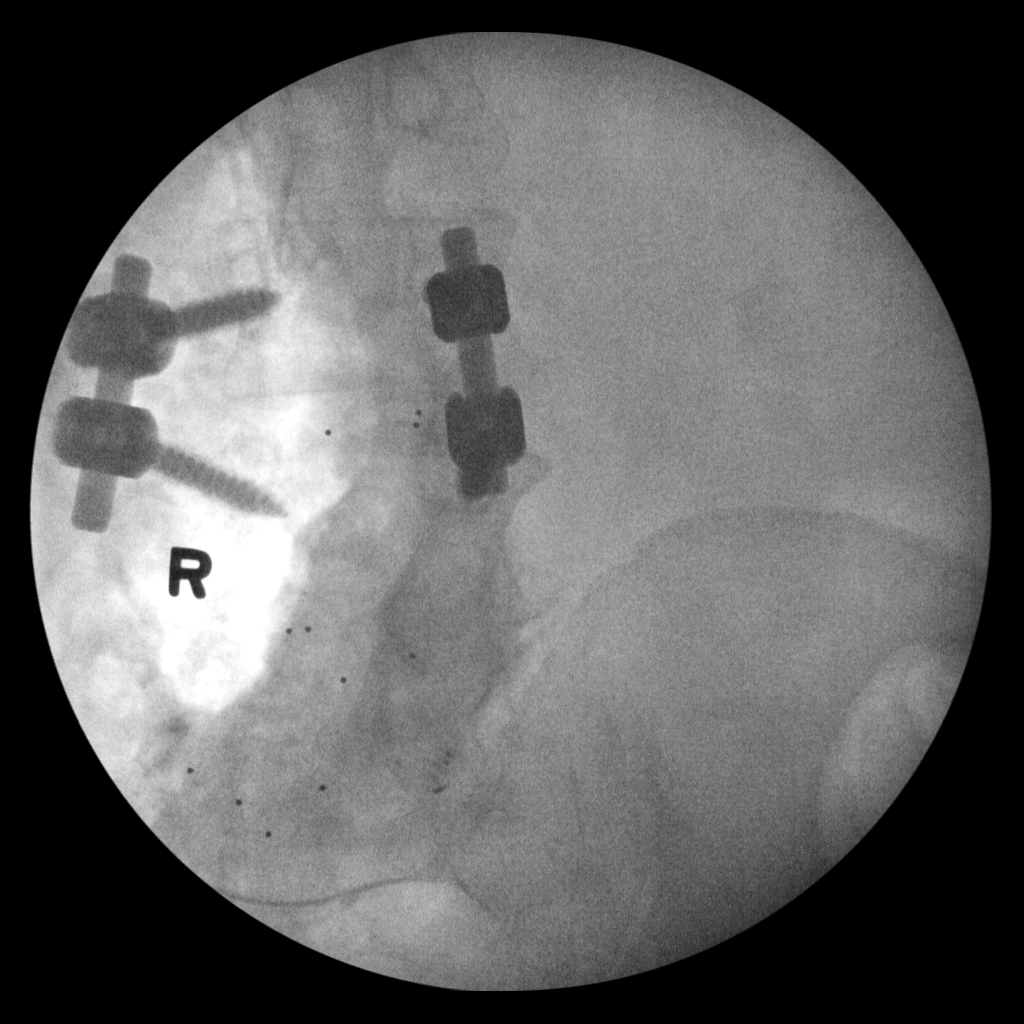

[4 of 4 positions shown; findings below may reference images not displayed]

FINDINGS: Frontal and lateral intraoperative views of the mid -
lower lumbar spine are submitted postoperatively for
interpretation.

Posterior rod bipedicular screw fixation and interbody fusion
material is now identified at L3-L4.
Interbody fusion material at L4-L5 and L5-S1 is unchanged.
IMPRESSION: Posterior and interbody fusion at L3-L4.

## 2011-05-25 LAB — COMPREHENSIVE METABOLIC PANEL
ALT: 17
AST: 20
Albumin: 4.1
Alkaline Phosphatase: 61
Potassium: 4.8
Sodium: 131 — ABNORMAL LOW
Total Protein: 7.2

## 2011-05-25 LAB — CBC
Platelets: 268
RDW: 11.9

## 2011-05-25 LAB — DIFFERENTIAL
Basophils Relative: 1
Eosinophils Absolute: 0.1
Eosinophils Relative: 1
Monocytes Absolute: 0.5
Monocytes Relative: 8

## 2011-05-25 LAB — URINALYSIS, ROUTINE W REFLEX MICROSCOPIC
Bilirubin Urine: NEGATIVE
Hgb urine dipstick: NEGATIVE
Nitrite: NEGATIVE
Specific Gravity, Urine: 1.021 (ref 1.005–1.035)
pH: 6.5

## 2011-05-25 LAB — URINE CULTURE: Colony Count: NO GROWTH

## 2011-05-25 LAB — APTT: aPTT: 32

## 2013-09-11 ENCOUNTER — Encounter (HOSPITAL_COMMUNITY): Payer: Self-pay | Admitting: Emergency Medicine

## 2013-09-11 ENCOUNTER — Emergency Department (HOSPITAL_COMMUNITY): Payer: No Typology Code available for payment source

## 2013-09-11 ENCOUNTER — Emergency Department (HOSPITAL_COMMUNITY)
Admission: EM | Admit: 2013-09-11 | Discharge: 2013-09-12 | Disposition: A | Discharge status: Home / Self Care | Payer: No Typology Code available for payment source | Attending: Emergency Medicine | Admitting: Emergency Medicine

## 2013-09-11 DIAGNOSIS — R5381 Other malaise: Secondary | ICD-10-CM | POA: Insufficient documentation

## 2013-09-11 DIAGNOSIS — IMO0002 Reserved for concepts with insufficient information to code with codable children: Secondary | ICD-10-CM | POA: Insufficient documentation

## 2013-09-11 DIAGNOSIS — M4800 Spinal stenosis, site unspecified: Secondary | ICD-10-CM

## 2013-09-11 DIAGNOSIS — S161XXA Strain of muscle, fascia and tendon at neck level, initial encounter: Secondary | ICD-10-CM

## 2013-09-11 DIAGNOSIS — Z9889 Other specified postprocedural states: Secondary | ICD-10-CM | POA: Insufficient documentation

## 2013-09-11 DIAGNOSIS — Y9389 Activity, other specified: Secondary | ICD-10-CM | POA: Insufficient documentation

## 2013-09-11 DIAGNOSIS — M545 Low back pain, unspecified: Secondary | ICD-10-CM

## 2013-09-11 DIAGNOSIS — R209 Unspecified disturbances of skin sensation: Secondary | ICD-10-CM | POA: Insufficient documentation

## 2013-09-11 DIAGNOSIS — Y92410 Unspecified street and highway as the place of occurrence of the external cause: Secondary | ICD-10-CM | POA: Insufficient documentation

## 2013-09-11 DIAGNOSIS — S335XXA Sprain of ligaments of lumbar spine, initial encounter: Secondary | ICD-10-CM | POA: Insufficient documentation

## 2013-09-11 DIAGNOSIS — R5383 Other fatigue: Secondary | ICD-10-CM

## 2013-09-11 MED ORDER — ALUM & MAG HYDROXIDE-SIMETH 200-200-20 MG/5ML PO SUSP
15.0000 mL | Freq: Once | ORAL | Status: AC
  Administered 2013-09-11: 15 mL via ORAL
  Filled 2013-09-11: qty 30

## 2013-09-11 MED ORDER — OXYCODONE-ACETAMINOPHEN 5-325 MG PO TABS
2.0000 | ORAL_TABLET | Freq: Once | ORAL | Status: AC
  Administered 2013-09-11: 2 via ORAL
  Filled 2013-09-11: qty 2
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
## 2013-09-11 NOTE — ED Notes (Addendum)
Pt requesting "maalox" because of heartburn. Baker Janus, NP made aware.

## 2013-09-11 NOTE — ED Notes (Signed)
Pt is still in MRI.

## 2013-09-11 NOTE — ED Notes (Signed)
This RN called MRI to determine how much longer it will be until they transport the patient to MRI and reported it will be about 40 minutes. Trevor Janus, NP and pt made aware.

## 2013-09-12 MED ORDER — METHOCARBAMOL 500 MG PO TABS: 500.0000 mg | ORAL_TABLET | Freq: Two times a day (BID) | ORAL | Status: DC

## 2013-09-12 MED ORDER — OXYCODONE-ACETAMINOPHEN 5-325 MG PO TABS: 1.0000 | ORAL_TABLET | ORAL | Status: DC | PRN

## 2013-09-12 NOTE — ED Notes (Signed)
Per Olean Ree, NP pt was helped to sit up to use the urinal.

## 2013-09-12 NOTE — ED Provider Notes (Signed)
Medical screening examination/treatment/procedure(s) were conducted as a shared visit with non-physician practitioner(s) and myself.  I personally evaluated the patient during the encounter.  EKG Interpretation   None        Mirela Parsley, MD 09/12/13 0927 

## 2013-09-12 NOTE — ED Provider Notes (Signed)
Patient's MRI reveals slightly worsening spinal stenosis  C collar removed patient ambulated in the ED  DC home with Rx for Robaxin and Percoet as well as instructions to FU with DR. Kenyon Ana, NP 09/12/13 [phone]

## 2013-09-12 NOTE — ED Notes (Signed)
Pt is able to ambulate with steady gait, without assistance.

## 2013-09-12 NOTE — ED Notes (Signed)
Pt has returned from MRI. 

## 2013-09-12 NOTE — Discharge Instructions (Signed)
The MRI tonight reveals slightly worsening spinal stenosis  Pleas make an appointment with Dr. Louanne Skye for further evaluation You have been given prescriptions for pain control and a muscle relaxer

## 2016-09-21 ENCOUNTER — Encounter: Payer: Self-pay | Admitting: Primary Care

## 2017-11-28 ENCOUNTER — Inpatient Hospital Stay (HOSPITAL_COMMUNITY): Payer: Medicare Other

## 2017-11-28 ENCOUNTER — Inpatient Hospital Stay (HOSPITAL_COMMUNITY): Payer: Medicare Other | Admitting: Anesthesiology

## 2017-11-28 ENCOUNTER — Encounter (HOSPITAL_COMMUNITY)
Admission: EM | Disposition: A | Discharge status: Home / Self Care | Payer: Self-pay | Source: Home / Self Care | Attending: Internal Medicine

## 2017-11-28 ENCOUNTER — Other Ambulatory Visit: Payer: Self-pay

## 2017-11-28 ENCOUNTER — Inpatient Hospital Stay (HOSPITAL_COMMUNITY)
Admission: EM | Admit: 2017-11-28 | Discharge: 2017-12-02 | DRG: 854 | Disposition: A | Discharge status: Home / Self Care | Payer: Medicare Other | Attending: Internal Medicine | Admitting: Internal Medicine

## 2017-11-28 ENCOUNTER — Encounter (HOSPITAL_COMMUNITY): Payer: Self-pay | Admitting: Emergency Medicine

## 2017-11-28 ENCOUNTER — Emergency Department (HOSPITAL_COMMUNITY): Payer: Medicare Other

## 2017-11-28 DIAGNOSIS — A419 Sepsis, unspecified organism: Secondary | ICD-10-CM

## 2017-11-28 DIAGNOSIS — R6510 Systemic inflammatory response syndrome (SIRS) of non-infectious origin without acute organ dysfunction: Secondary | ICD-10-CM

## 2017-11-28 DIAGNOSIS — Z905 Acquired absence of kidney: Secondary | ICD-10-CM | POA: Diagnosis not present

## 2017-11-28 DIAGNOSIS — E669 Obesity, unspecified: Secondary | ICD-10-CM | POA: Diagnosis present

## 2017-11-28 DIAGNOSIS — N499 Inflammatory disorder of unspecified male genital organ: Secondary | ICD-10-CM

## 2017-11-28 DIAGNOSIS — N5089 Other specified disorders of the male genital organs: Secondary | ICD-10-CM | POA: Diagnosis present

## 2017-11-28 DIAGNOSIS — Z6837 Body mass index (BMI) 37.0-37.9, adult: Secondary | ICD-10-CM

## 2017-11-28 DIAGNOSIS — I517 Cardiomegaly: Secondary | ICD-10-CM | POA: Diagnosis not present

## 2017-11-28 DIAGNOSIS — I119 Hypertensive heart disease without heart failure: Secondary | ICD-10-CM | POA: Diagnosis present

## 2017-11-28 DIAGNOSIS — K2970 Gastritis, unspecified, without bleeding: Secondary | ICD-10-CM | POA: Diagnosis not present

## 2017-11-28 DIAGNOSIS — R112 Nausea with vomiting, unspecified: Secondary | ICD-10-CM

## 2017-11-28 DIAGNOSIS — N492 Inflammatory disorders of scrotum: Secondary | ICD-10-CM | POA: Diagnosis present

## 2017-11-28 DIAGNOSIS — L02215 Cutaneous abscess of perineum: Secondary | ICD-10-CM | POA: Diagnosis present

## 2017-11-28 DIAGNOSIS — Z79899 Other long term (current) drug therapy: Secondary | ICD-10-CM

## 2017-11-28 DIAGNOSIS — B9561 Methicillin susceptible Staphylococcus aureus infection as the cause of diseases classified elsewhere: Secondary | ICD-10-CM | POA: Diagnosis present

## 2017-11-28 DIAGNOSIS — E222 Syndrome of inappropriate secretion of antidiuretic hormone: Secondary | ICD-10-CM | POA: Diagnosis present

## 2017-11-28 DIAGNOSIS — A4101 Sepsis due to Methicillin susceptible Staphylococcus aureus: Secondary | ICD-10-CM | POA: Diagnosis not present

## 2017-11-28 DIAGNOSIS — R509 Fever, unspecified: Secondary | ICD-10-CM

## 2017-11-28 HISTORY — DX: Essential (primary) hypertension: I10

## 2017-11-28 HISTORY — PX: IRRIGATION AND DEBRIDEMENT ABSCESS: SHX5252

## 2017-11-28 LAB — CBC WITH DIFFERENTIAL/PLATELET
BASOS ABS: 0 10*3/uL (ref 0.0–0.1)
BASOS PCT: 0 %
Eosinophils Absolute: 0 10*3/uL (ref 0.0–0.7)
Eosinophils Relative: 0 %
HCT: 44.1 % (ref 39.0–52.0)
Hemoglobin: 15.6 g/dL (ref 13.0–17.0)
LYMPHS PCT: 4 %
Lymphs Abs: 0.9 10*3/uL (ref 0.7–4.0)
MCH: 32.3 pg (ref 26.0–34.0)
MCHC: 35.4 g/dL (ref 30.0–36.0)
MCV: 91.3 fL (ref 78.0–100.0)
Monocytes Absolute: 2.7 10*3/uL — ABNORMAL HIGH (ref 0.1–1.0)
Monocytes Relative: 11 %
Neutro Abs: 20.4 10*3/uL — ABNORMAL HIGH (ref 1.7–7.7)
Neutrophils Relative %: 85 %
PLATELETS: 245 10*3/uL (ref 150–400)
RBC: 4.83 MIL/uL (ref 4.22–5.81)
RDW: 12.5 % (ref 11.5–15.5)
WBC: 24.1 10*3/uL — AB (ref 4.0–10.5)

## 2017-11-28 LAB — COMPREHENSIVE METABOLIC PANEL
ALK PHOS: 84 U/L (ref 38–126)
ALT: 25 U/L (ref 17–63)
ANION GAP: 10 (ref 5–15)
AST: 25 U/L (ref 15–41)
Albumin: 3.3 g/dL — ABNORMAL LOW (ref 3.5–5.0)
BILIRUBIN TOTAL: 1.7 mg/dL — AB (ref 0.3–1.2)
BUN: 17 mg/dL (ref 6–20)
CALCIUM: 9 mg/dL (ref 8.9–10.3)
CO2: 22 mmol/L (ref 22–32)
Chloride: 94 mmol/L — ABNORMAL LOW (ref 101–111)
Creatinine, Ser: 1.57 mg/dL — ABNORMAL HIGH (ref 0.61–1.24)
GFR calc Af Amer: 55 mL/min — ABNORMAL LOW (ref >60)
GFR, EST NON AFRICAN AMERICAN: 48 mL/min — AB (ref >60)
Glucose, Bld: 92 mg/dL (ref 65–99)
POTASSIUM: 4 mmol/L (ref 3.5–5.1)
Sodium: 126 mmol/L — ABNORMAL LOW (ref 135–145)
TOTAL PROTEIN: 7.2 g/dL (ref 6.5–8.1)

## 2017-11-28 LAB — TYPE AND SCREEN
ABO/RH(D): B POS
Antibody Screen: NEGATIVE

## 2017-11-28 LAB — ABO/RH: ABO/RH(D): B POS

## 2017-11-28 LAB — I-STAT CHEM 8, ED
BUN: 15 mg/dL (ref 6–20)
CALCIUM ION: 1.17 mmol/L (ref 1.15–1.40)
Chloride: 90 mmol/L — ABNORMAL LOW (ref 101–111)
Creatinine, Ser: 1.7 mg/dL — ABNORMAL HIGH (ref 0.61–1.24)
Glucose, Bld: 94 mg/dL (ref 65–99)
HCT: 47 % (ref 39.0–52.0)
Hemoglobin: 16 g/dL (ref 13.0–17.0)
Potassium: 4.3 mmol/L (ref 3.5–5.1)
SODIUM: 128 mmol/L — AB (ref 135–145)
TCO2: 25 mmol/L (ref 22–32)

## 2017-11-28 LAB — I-STAT CG4 LACTIC ACID, ED
Lactic Acid, Venous: 0.92 mmol/L (ref 0.5–1.9)
Lactic Acid, Venous: 1.11 mmol/L (ref 0.5–1.9)

## 2017-11-28 LAB — POC OCCULT BLOOD, ED: Fecal Occult Bld: NEGATIVE

## 2017-11-28 LAB — PROTIME-INR
INR: 1.29
PROTHROMBIN TIME: 16 s — AB (ref 11.4–15.2)

## 2017-11-28 LAB — TSH: TSH: 1.754 u[IU]/mL (ref 0.350–4.500)

## 2017-11-28 LAB — APTT: aPTT: 35 seconds (ref 24–36)

## 2017-11-28 LAB — HEMOGLOBIN A1C
Hgb A1c MFr Bld: 5.2 % (ref 4.8–5.6)
Mean Plasma Glucose: 102.54 mg/dL

## 2017-11-28 SURGERY — IRRIGATION AND DEBRIDEMENT ABSCESS
Anesthesia: General | Site: Scrotum

## 2017-11-28 MED ORDER — PROMETHAZINE HCL 25 MG/ML IJ SOLN: 6.2500 mg | INTRAMUSCULAR | Status: DC | PRN

## 2017-11-28 MED ORDER — DEXAMETHASONE SODIUM PHOSPHATE 4 MG/ML IJ SOLN
INTRAMUSCULAR | Status: DC | PRN
Start: 2017-11-28 — End: 2017-11-28
  Administered 2017-11-28: 4 mg via INTRAVENOUS

## 2017-11-28 MED ORDER — HYDROCODONE-ACETAMINOPHEN 5-325 MG PO TABS
1.0000 | ORAL_TABLET | ORAL | Status: DC | PRN
  Administered 2017-11-29 (×3): 1 via ORAL
  Administered 2017-12-01: 2 via ORAL
  Filled 2017-11-28: qty 1
  Filled 2017-11-28: qty 2
  Filled 2017-11-28 (×2): qty 1

## 2017-11-28 MED ORDER — ONDANSETRON HCL 4 MG/2ML IJ SOLN
4.0000 mg | Freq: Four times a day (QID) | INTRAMUSCULAR | Status: DC | PRN
  Administered 2017-11-28 – 2017-11-29 (×2): 4 mg via INTRAVENOUS
  Filled 2017-11-28 (×2): qty 2

## 2017-11-28 MED ORDER — HYDROMORPHONE HCL 1 MG/ML IJ SOLN: 0.2500 mg | INTRAMUSCULAR | Status: DC | PRN

## 2017-11-28 MED ORDER — LACTATED RINGERS IV SOLN
INTRAVENOUS | Status: DC | PRN
  Administered 2017-11-28: 20:00:00 via INTRAVENOUS

## 2017-11-28 MED ORDER — ONDANSETRON HCL 4 MG/2ML IJ SOLN
INTRAMUSCULAR | Status: AC
Start: 2017-11-28 — End: ?
  Filled 2017-11-28: qty 2

## 2017-11-28 MED ORDER — LIDOCAINE 2% (20 MG/ML) 5 ML SYRINGE
INTRAMUSCULAR | Status: DC | PRN
  Administered 2017-11-28: 100 mg via INTRAVENOUS

## 2017-11-28 MED ORDER — ONDANSETRON HCL 4 MG PO TABS: 4.0000 mg | ORAL_TABLET | Freq: Four times a day (QID) | ORAL | Status: DC | PRN

## 2017-11-28 MED ORDER — HYDROCODONE-ACETAMINOPHEN 7.5-325 MG PO TABS: 1.0000 | ORAL_TABLET | Freq: Once | ORAL | Status: DC | PRN

## 2017-11-28 MED ORDER — MIDAZOLAM HCL 2 MG/2ML IJ SOLN
INTRAMUSCULAR | Status: DC | PRN
  Administered 2017-11-28: 2 mg via INTRAVENOUS

## 2017-11-28 MED ORDER — LACTATED RINGERS IV SOLN
Freq: Once | INTRAVENOUS | Status: AC
  Administered 2017-11-28: 19:00:00 via INTRAVENOUS

## 2017-11-28 MED ORDER — MIDAZOLAM HCL 2 MG/2ML IJ SOLN
INTRAMUSCULAR | Status: AC
  Filled 2017-11-28: qty 2

## 2017-11-28 MED ORDER — SODIUM CHLORIDE 0.9 % IV BOLUS
30.0000 mL/kg | Freq: Once | INTRAVENOUS | Status: AC
  Administered 2017-11-28: 3633 mL via INTRAVENOUS

## 2017-11-28 MED ORDER — FENTANYL CITRATE (PF) 100 MCG/2ML IJ SOLN
INTRAMUSCULAR | Status: AC
  Filled 2017-11-28: qty 2

## 2017-11-28 MED ORDER — SODIUM CHLORIDE 0.9 % IV SOLN
INTRAVENOUS | Status: AC
  Filled 2017-11-28: qty 500000

## 2017-11-28 MED ORDER — ACETAMINOPHEN 650 MG RE SUPP: 650.0000 mg | Freq: Four times a day (QID) | RECTAL | Status: DC | PRN

## 2017-11-28 MED ORDER — VANCOMYCIN HCL IN DEXTROSE 1-5 GM/200ML-% IV SOLN
1000.0000 mg | Freq: Once | INTRAVENOUS | Status: AC
  Administered 2017-11-28: 1000 mg via INTRAVENOUS
  Filled 2017-11-28: qty 200

## 2017-11-28 MED ORDER — PIPERACILLIN-TAZOBACTAM 3.375 G IVPB 30 MIN
3.3750 g | Freq: Once | INTRAVENOUS | Status: AC
  Administered 2017-11-28: 3.375 g via INTRAVENOUS
  Filled 2017-11-28: qty 50

## 2017-11-28 MED ORDER — PROPOFOL 10 MG/ML IV BOLUS
INTRAVENOUS | Status: DC | PRN
  Administered 2017-11-28: 200 mg via INTRAVENOUS

## 2017-11-28 MED ORDER — IOPAMIDOL (ISOVUE-300) INJECTION 61%
100.0000 mL | Freq: Once | INTRAVENOUS | Status: AC | PRN
  Administered 2017-11-28: 100 mL via INTRAVENOUS

## 2017-11-28 MED ORDER — MEPERIDINE HCL 50 MG/ML IJ SOLN: 6.2500 mg | INTRAMUSCULAR | Status: DC | PRN

## 2017-11-28 MED ORDER — PIPERACILLIN-TAZOBACTAM 3.375 G IVPB 30 MIN: 3.3750 g | Freq: Once | INTRAVENOUS | Status: DC

## 2017-11-28 MED ORDER — PROPOFOL 10 MG/ML IV BOLUS
INTRAVENOUS | Status: AC
  Filled 2017-11-28: qty 20

## 2017-11-28 MED ORDER — FENTANYL CITRATE (PF) 100 MCG/2ML IJ SOLN
INTRAMUSCULAR | Status: DC | PRN
  Administered 2017-11-28 (×4): 50 ug via INTRAVENOUS

## 2017-11-28 MED ORDER — MORPHINE SULFATE (PF) 4 MG/ML IV SOLN
4.0000 mg | Freq: Once | INTRAVENOUS | Status: AC
  Administered 2017-11-28: 4 mg via INTRAVENOUS
  Filled 2017-11-28: qty 1

## 2017-11-28 MED ORDER — SODIUM CHLORIDE 0.9 % IV SOLN
INTRAVENOUS | Status: DC | PRN
  Administered 2017-11-28: 500 mL

## 2017-11-28 MED ORDER — PIPERACILLIN-TAZOBACTAM 3.375 G IVPB
3.3750 g | Freq: Three times a day (TID) | INTRAVENOUS | Status: DC
  Administered 2017-11-29 – 2017-11-30 (×4): 3.375 g via INTRAVENOUS
  Filled 2017-11-28 (×6): qty 50

## 2017-11-28 MED ORDER — IOPAMIDOL (ISOVUE-300) INJECTION 61%
INTRAVENOUS | Status: AC
  Filled 2017-11-28: qty 100

## 2017-11-28 MED ORDER — CLINDAMYCIN PHOSPHATE 600 MG/50ML IV SOLN
600.0000 mg | Freq: Three times a day (TID) | INTRAVENOUS | Status: DC
  Administered 2017-11-29: 600 mg via INTRAVENOUS
  Filled 2017-11-28: qty 50

## 2017-11-28 MED ORDER — SODIUM CHLORIDE 0.9% FLUSH
3.0000 mL | Freq: Two times a day (BID) | INTRAVENOUS | Status: DC
  Administered 2017-11-29 – 2017-12-02 (×3): 3 mL via INTRAVENOUS

## 2017-11-28 MED ORDER — GLYCOPYRROLATE 0.2 MG/ML IV SOSY
PREFILLED_SYRINGE | INTRAVENOUS | Status: AC
  Filled 2017-11-28: qty 5

## 2017-11-28 MED ORDER — ACETAMINOPHEN 325 MG PO TABS: 650.0000 mg | ORAL_TABLET | Freq: Four times a day (QID) | ORAL | Status: DC | PRN

## 2017-11-28 MED ORDER — VANCOMYCIN HCL 10 G IV SOLR
1500.0000 mg | INTRAVENOUS | Status: DC
  Administered 2017-11-28 – 2017-11-29 (×2): 1500 mg via INTRAVENOUS
  Filled 2017-11-28 (×4): qty 1500

## 2017-11-28 MED ORDER — 0.9 % SODIUM CHLORIDE (POUR BTL) OPTIME
TOPICAL | Status: DC | PRN
  Administered 2017-11-28: 1000 mL

## 2017-11-28 SURGICAL SUPPLY — 43 items
APL SKNCLS STERI-STRIP NONHPOA (GAUZE/BANDAGES/DRESSINGS)
BENZOIN TINCTURE PRP APPL 2/3 (GAUZE/BANDAGES/DRESSINGS) IMPLANT
BLADE HEX COATED 2.75 (ELECTRODE) ×2 IMPLANT
BLADE SURG 15 STRL LF DISP TIS (BLADE) ×2 IMPLANT
BLADE SURG 15 STRL SS (BLADE) ×3
BLADE SURG SZ10 CARB STEEL (BLADE) ×2 IMPLANT
BNDG GAUZE ELAST 4 BULKY (GAUZE/BANDAGES/DRESSINGS) ×2 IMPLANT
BRIEF STRETCH FOR OB PAD LRG (UNDERPADS AND DIAPERS) ×2 IMPLANT
CLOSURE WOUND 1/2 X4 (GAUZE/BANDAGES/DRESSINGS)
COVER SURGICAL LIGHT HANDLE (MISCELLANEOUS) ×3 IMPLANT
DECANTER SPIKE VIAL GLASS SM (MISCELLANEOUS) IMPLANT
DRAPE LAPAROSCOPIC ABDOMINAL (DRAPES) IMPLANT
DRAPE LAPAROTOMY T 102X78X121 (DRAPES) IMPLANT
DRAPE LAPAROTOMY TRNSV 102X78 (DRAPE) IMPLANT
DRAPE POUCH INSTRU U-SHP 10X18 (DRAPES) ×1 IMPLANT
DRAPE SHEET LG 3/4 BI-LAMINATE (DRAPES) IMPLANT
ELECT PENCIL ROCKER SW 15FT (MISCELLANEOUS) ×4 IMPLANT
ELECT REM PT RETURN 15FT ADLT (MISCELLANEOUS) ×4 IMPLANT
EVACUATOR SILICONE 100CC (DRAIN) IMPLANT
GAUZE SPONGE 4X4 12PLY STRL (GAUZE/BANDAGES/DRESSINGS) ×6 IMPLANT
GLOVE BIOGEL PI IND STRL 7.0 (GLOVE) ×1 IMPLANT
GLOVE BIOGEL PI INDICATOR 7.0 (GLOVE) ×2
GLOVE EUDERMIC 7 POWDERFREE (GLOVE) ×4 IMPLANT
GOWN STRL REUS W/TWL LRG LVL3 (GOWN DISPOSABLE) ×3 IMPLANT
GOWN STRL REUS W/TWL XL LVL3 (GOWN DISPOSABLE) ×6 IMPLANT
KIT BASIN OR (CUSTOM PROCEDURE TRAY) ×4 IMPLANT
NDL HYPO 25X1 1.5 SAFETY (NEEDLE) ×2 IMPLANT
NEEDLE HYPO 25X1 1.5 SAFETY (NEEDLE) IMPLANT
NS IRRIG 1000ML POUR BTL (IV SOLUTION) ×4 IMPLANT
PACK BASIC VI WITH GOWN DISP (CUSTOM PROCEDURE TRAY) ×4 IMPLANT
PAD ABD 7.5X8 STRL (GAUZE/BANDAGES/DRESSINGS) ×2 IMPLANT
SHEET LAVH (DRAPES) ×2 IMPLANT
SOL PREP POV-IOD 4OZ 10% (MISCELLANEOUS) ×3 IMPLANT
SPONGE LAP 18X18 RF (DISPOSABLE) ×3 IMPLANT
SPONGE LAP 4X18 RFD (DISPOSABLE) ×2 IMPLANT
STAPLER VISISTAT 35W (STAPLE) IMPLANT
STRIP CLOSURE SKIN 1/2X4 (GAUZE/BANDAGES/DRESSINGS) IMPLANT
SUT MNCRL AB 4-0 PS2 18 (SUTURE) IMPLANT
SUT VIC AB 3-0 SH 18 (SUTURE) IMPLANT
SYR CONTROL 10ML LL (SYRINGE) ×2 IMPLANT
TOWEL OR 17X26 10 PK STRL BLUE (TOWEL DISPOSABLE) ×3 IMPLANT
WATER STERILE IRR 1000ML POUR (IV SOLUTION) ×3 IMPLANT
YANKAUER SUCT BULB TIP 10FT TU (MISCELLANEOUS) IMPLANT

## 2017-11-28 NOTE — Transfer of Care (Signed)
Immediate Anesthesia Transfer of Care Note  Patient: [name]  Procedure(s) Performed: IRRIGATION AND DEBRIDEMENT SCROTAL AND PERINEAL ABSCESS (N/A Scrotum)  Patient Location: PACU  Anesthesia Type:General  Level of Consciousness: drowsy  Airway & Oxygen Therapy: Patient Spontanous Breathing and Patient connected to face mask  Post-op Assessment: Report given to RN and Post -op Vital signs reviewed and stable  Post vital signs: Reviewed and stable  Last Vitals:  Vitals Value Taken Time  BP    Temp    Pulse    Resp    SpO2      Last Pain:  Vitals:   11/28/17 1825  TempSrc: Oral  PainSc: 8       Patients Stated Pain Goal: 4 (19/14/78 2956)  Complications: No apparent anesthesia complications

## 2017-11-28 NOTE — Anesthesia Postprocedure Evaluation (Signed)
Anesthesia Post Note  Patient: [name]  Procedure(s) Performed: IRRIGATION AND DEBRIDEMENT SCROTAL AND PERINEAL ABSCESS (N/A Scrotum)     Patient location during evaluation: PACU Anesthesia Type: General Level of consciousness: awake and alert Pain management: pain level controlled Vital Signs Assessment: post-procedure vital signs reviewed and stable Respiratory status: spontaneous breathing, nonlabored ventilation and respiratory function stable Cardiovascular status: blood pressure returned to baseline and stable Postop Assessment: no apparent nausea or vomiting Anesthetic complications: no    Last Vitals:  Vitals:   11/28/17 2130 11/28/17 2145  BP: 136/80   Pulse: 88   Resp: 19   Temp:  37.5 C  SpO2: 95%     Last Pain:  Vitals:   11/28/17 2130  TempSrc:   PainSc: 0-No pain                 Gianna Calef A.

## 2017-11-28 NOTE — ED Notes (Signed)
ED TO INPATIENT HANDOFF REPORT  Name/Age/Gender [name] 57 y.o. male  Code Status    Code Status Orders  (From admission, onward)        Start     Ordered   11/28/17 1626  Full code  Continuous     11/28/17 1630    Code Status History    This patient has a current code status but no historical code status.      Home/SNF/Other Home  Chief Complaint rectal bledding, swollen scrotum  Level of Care/Admitting Diagnosis ED Disposition    ED Disposition Condition [facility] Area: [place] [[id number]]  Level of Care: Stepdown [14]  Admit to SDU based on following criteria: Hemodynamic compromise or significant risk of instability:  Patient requiring short term acute titration and management of vasoactive drips, and invasive monitoring (i.e., CVP and Arterial line).  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 Hypertension     Allergies No Known Allergies  IV Location/Drains/Wounds Patient Lines/Drains/Airways Status   Active Line/Drains/Airways    Name:   Placement date:   Placement time:   Site:   Days:   Peripheral IV 11/28/17 Left Antecubital   11/28/17    1421    Antecubital   less than 1          Labs/Imaging Results for orders placed or performed during the hospital encounter of 11/28/17 (from the past 48 hour(s))  Comprehensive metabolic panel     Status: Abnormal   Collection Time: 11/28/17 11:39 AM  Result Value Ref Range   Sodium 126 (L) 135 - 145 mmol/L   Potassium 4.0 3.5 - 5.1 mmol/L   Chloride 94 (L) 101 - 111  mmol/L   CO2 22 22 - 32 mmol/L   Glucose, Bld 92 65 - 99 mg/dL   BUN 17 6 - 20 mg/dL   Creatinine, Ser 1.57 (H) 0.61 - 1.24 mg/dL   Calcium 9.0 8.9 - 10.3 mg/dL   Total Protein 7.2 6.5 - 8.1 g/dL   Albumin 3.3 (L) 3.5 - 5.0 g/dL   AST 25 15 - 41 U/L   ALT 25 17 - 63 U/L   Alkaline Phosphatase 84 38 - 126 U/L   Total Bilirubin 1.7 (H) 0.3 - 1.2 mg/dL   GFR calc non Af Amer 48 (L) >60 mL/min   GFR calc Af Amer 55 (L) >60 mL/min    Comment: (NOTE) The eGFR has been calculated using the CKD EPI equation. This calculation has not been validated in all clinical situations. eGFR's persistently <60 mL/min signify possible Chronic Kidney Disease.    Anion gap 10 5 - 15    Comment: Performed at [facility], [place] [address] [place], [place] [address]  CBC with Differential     Status: Abnormal   Collection Time: 11/28/17 11:39 AM  Result Value Ref Range   WBC 24.1 (H) 4.0 - 10.5 K/uL   RBC 4.83 4.22 - 5.81 MIL/uL   Hemoglobin 15.6 13.0 - 17.0 g/dL   HCT 44.1 39.0 - 52.0 %  MCV 91.3 78.0 - 100.0 fL   MCH 32.3 26.0 - 34.0 pg   MCHC 35.4 30.0 - 36.0 g/dL   RDW 12.5 11.5 - 15.5 %   Platelets 245 150 - 400 K/uL   Neutrophils Relative % 85 %   Neutro Abs 20.4 (H) 1.7 - 7.7 K/uL   Lymphocytes Relative 4 %   Lymphs Abs 0.9 0.7 - 4.0 K/uL   Monocytes Relative 11 %   Monocytes Absolute 2.7 (H) 0.1 - 1.0 K/uL   Eosinophils Relative 0 %   Eosinophils Absolute 0.0 0.0 - 0.7 K/uL   Basophils Relative 0 %   Basophils Absolute 0.0 0.0 - 0.1 K/uL    Comment: Performed at [facility], [place] [address], [place], [place] [address]  I-Stat CG4 Lactic Acid, ED     Status: None   Collection Time: 11/28/17 11:45 AM  Result Value Ref Range   Lactic Acid, Venous 0.92 0.5 - 1.9 mmol/L  I-Stat CG4 Lactic Acid, ED  (not at  [facility])     Status: None   Collection Time: 11/28/17  1:01 PM  Result Value Ref Range   Lactic Acid, Venous 1.11 0.5 - 1.9 mmol/L  I-Stat Chem 8,  ED     Status: Abnormal   Collection Time: 11/28/17  1:01 PM  Result Value Ref Range   Sodium 128 (L) 135 - 145 mmol/L   Potassium 4.3 3.5 - 5.1 mmol/L   Chloride 90 (L) 101 - 111 mmol/L   BUN 15 6 - 20 mg/dL   Creatinine, Ser 1.70 (H) 0.61 - 1.24 mg/dL   Glucose, Bld 94 65 - 99 mg/dL   Calcium, Ion 1.17 1.15 - 1.40 mmol/L   TCO2 25 22 - 32 mmol/L   Hemoglobin 16.0 13.0 - 17.0 g/dL   HCT 47.0 39.0 - 52.0 %  POC occult blood, ED     Status: None   Collection Time: 11/28/17  1:22 PM  Result Value Ref Range   Fecal Occult Bld NEGATIVE NEGATIVE   Ct Pelvis W Contrast  Result Date: 11/28/2017 CLINICAL DATA:  Scrotal swelling, perineal abscess. EXAM: CT PELVIS WITH CONTRAST TECHNIQUE: Multidetector CT imaging of the pelvis was performed using the standard protocol following the bolus administration of intravenous contrast. CONTRAST:  180m ISOVUE-300 IOPAMIDOL (ISOVUE-300) INJECTION 61% COMPARISON:  None. FINDINGS: Urinary Tract:  No abnormality visualized. Bowel:  Unremarkable visualized pelvic bowel loops. Vascular/Lymphatic: Mildly enlarged right inguinal lymph nodes are noted which most likely are inflammatory in etiology. No significant vascular abnormality seen. Reproductive: No mass is noted. There is noted significant wall thickening and edema involving the scrotum. Other: Inflammatory changes are seen in the soft tissues extending from the right side of the scrotum into the right perineal tissues and right inguinal region consistent with cellulitis. No abscess or fluid collection is noted. No soft tissue gas is seen at this time. Musculoskeletal: No suspicious bone lesions identified. IMPRESSION: Severe wall thickening and edema is seen involving the scrotum. Inflammatory changes are seen extending from the scrotum and to the right perineal and inguinal soft tissues. No abscess or soft tissue gas is seen at this time. Enlarged right inguinal adenopathy is noted which most likely is  inflammatory in etiology. These findings are consistent with cellulitis, but early or developing Fournier's gangrene cannot be excluded. Electronically Signed   By: JMarijo Conception M.D.   On: 11/28/2017 15:12    Pending Labs [employer](From admission, onward)   Start  Ordered   11/29/17 0500  Creatinine, serum  Daily,   R     11/28/17 1645   11/28/17 1628  Type and screen [place]  STAT,   R    Comments:  [place]    11/28/17 1630   11/28/17 1627  Hemoglobin A1c  Once,   R     11/28/17 1630   11/28/17 1627  TSH  Once,   R     11/28/17 1630   11/28/17 1624  HIV antibody (Routine Testing)  Once,   R     11/28/17 1630   11/28/17 1624  Protime-INR  STAT,   R     11/28/17 1630   11/28/17 1624  APTT  STAT,   R     11/28/17 1630   11/28/17 1524  Wound or Superficial Culture  Once,   STAT     11/28/17 1523   11/28/17 1217  Occult blood card to lab, stool  Once,   STAT     11/28/17 1216   11/28/17 1202  Blood Culture (routine x 2)  BLOOD CULTURE X 2,   STAT     11/28/17 1202   11/28/17 1055  Urinalysis, Routine w reflex microscopic  STAT,   STAT     11/28/17 1054      Vitals/Pain Today's Vitals   11/28/17 1522 11/28/17 1600 11/28/17 1623 11/28/17 1711  BP:  (!) 154/105  (!) 149/94  Pulse:  90  92  Resp:  (!) 23  13  Temp:      TempSrc:      SpO2:  96%  100%  Weight:      Height:      PainSc: 8   3      Isolation Precautions No active isolations  Medications Medications  iopamidol (ISOVUE-300) 61 % injection (has no administration in time range)  sodium chloride flush (NS) 0.9 % injection 3 mL (has no administration in time range)  acetaminophen (TYLENOL) tablet 650 mg (has no administration in time range)    Or  acetaminophen (TYLENOL) suppository 650 mg (has no administration in time range)  HYDROcodone-acetaminophen (NORCO/VICODIN) 5-325 MG per tablet 1-2 tablet (has no administration in time range)  ondansetron  (ZOFRAN) tablet 4 mg (has no administration in time range)    Or  ondansetron (ZOFRAN) injection 4 mg (has no administration in time range)  clindamycin (CLEOCIN) IVPB 600 mg (has no administration in time range)  vancomycin (VANCOCIN) 1,500 mg in sodium chloride 0.9 % 500 mL IVPB (has no administration in time range)  piperacillin-tazobactam (ZOSYN) IVPB 3.375 g (has no administration in time range)  piperacillin-tazobactam (ZOSYN) IVPB 3.375 g (0 g Intravenous Stopped 11/28/17 1413)  vancomycin (VANCOCIN) IVPB 1000 mg/200 mL premix (0 mg Intravenous Stopped 11/28/17 1522)  sodium chloride 0.9 % bolus 3,633 mL (0 mL/kg  121.1 kg Intravenous Stopped 11/28/17 1711)  iopamidol (ISOVUE-300) 61 % injection 100 mL (100 mLs Intravenous Contrast Given 11/28/17 1435)  morphine 4 MG/ML injection 4 mg (4 mg Intravenous Given 11/28/17 1556)    Mobility walks

## 2017-11-28 NOTE — Anesthesia Procedure Notes (Signed)
Procedure Name: LMA Insertion Date/Time: 11/28/2017 8:21 PM Performed by: Claudia Desanctis, CRNA Pre-anesthesia Checklist: Emergency Drugs available, Patient identified, Suction available and Patient being monitored Patient Re-evaluated:Patient Re-evaluated prior to induction Oxygen Delivery Method: Circle system utilized Preoxygenation: Pre-oxygenation with 100% oxygen Induction Type: IV induction Ventilation: Mask ventilation without difficulty LMA: LMA inserted LMA Size: 5.0 Number of attempts: 1 Placement Confirmation: positive ETCO2 and breath sounds checked- equal and bilateral Tube secured with: Tape Dental Injury: Teeth and Oropharynx as per pre-operative assessment

## 2017-11-28 NOTE — ED Triage Notes (Signed)
Pt complaint of worsening weakness and scrotal swelling over past week. New onset bright red bleeding from rectum this morning.

## 2017-11-28 NOTE — ED Notes (Signed)
When asked if patient was explained about procedure/risks/benefits, pt said not yet therefore did not do informed consent here in ED.

## 2017-11-28 NOTE — H&P (Signed)
[facility] History and Physical   Patient: [name] QQP:[id number]   PCP: Rogers Blocker, MD DOB: Jan 12, 1961   DOA: 11/28/2017   DOS: 11/28/2017   DOS: the patient was seen and examined on 11/28/2017 Patient coming from: The patient is coming from home.  Chief Complaint: scrotal swelling  HPI: [name] is a 57 y.o. male with Past medical history of HTN. Patient presents with complaints of swelling and redness of the scrotum ongoing since last 1 week.  Complains about having burning pain difficulty walking due to the swelling.  Also had fever with chills and nausea without any vomiting.  No abdominal pain no chest pain no diarrhea.  He reports that he had some bleeding from the cyst this morning. He mentions that he had sustained some skin tag after his back surgery a couple of years ago which he thinks is probably got infected. Denies any recent infections recent procedures.  ED Course: Presented with severe swelling of the scrotum, CT abdomen pelvis showed possible early Fournier grandparents.  Urology was consulted.  Patient will be taken to the OR.  At his baseline ambulates without support And is independent for most of his ADL; manages his medication on his own.  Review of Systems: as mentioned in the history of present illness.  All other systems reviewed and are negative.  Past Medical History:  Diagnosis Date  . Hypertension    Past Surgical History:  Procedure Laterality Date  . BACK SURGERY    . c1-c7 surgery    . kidney removed     Social History:  reports that he has never smoked. He has never used smokeless tobacco. He reports that he does not drink alcohol. His drug history is not on file.  No Known Allergies  History reviewed. No pertinent family history.   Prior to Admission medications   Medication Sig Start Date End Date Taking? Authorizing Provider  amLODipine (NORVASC) 5 MG tablet Take 5 mg by mouth daily.   Yes [provider]    aspirin EC 81 MG tablet Take 162-240 mg by mouth every 6 (six) hours as needed for mild pain or moderate pain.   Yes [provider]  Naproxen Sod-diphenhydrAMINE (ALEVE PM) 220-25 MG TABS Take 1 tablet by mouth at bedtime as needed (for pain/sleep).   Yes [provider]    Physical Exam: Vitals:   11/28/17 1700 11/28/17 1711 11/28/17 1730 11/28/17 1801  BP: (!) 175/119 (!) 149/94 (!) 156/98 (!) 139/92  Pulse: 89 92  79  Resp: 11 13 18 15   Temp:      TempSrc:      SpO2: 98% 100%  100%  Weight:      Height:        General: Alert, Awake and Oriented to Time, Place and Person. Appear in mild distress, affect appropriate Eyes: PERRL, Conjunctiva normal ENT: Oral Mucosa clear moist. Neck: no JVD, no Abnormal Mass Or lumps Cardiovascular: S1 and S2 Present, no Murmur, Peripheral Pulses Present Respiratory: normal respiratory effort, Bilateral Air entry equal and Decreased, no use of accessory muscle, Clear to Auscultation, no Crackles, no wheezes Abdomen: Bowel Sound present, Soft and no tenderness, no hernia Skin: no redness, no Rash, no induration Extremities: no Pedal edema, no calf tenderness Neurologic: Grossly no focal neuro deficit. Bilaterally Equal motor strength  Labs on Admission:  CBC: Recent Labs  Lab 11/28/17 1139 11/28/17 1301  WBC 24.1*  --   NEUTROABS 20.4*  --  HGB 15.6 16.0  HCT 44.1 47.0  MCV 91.3  --   PLT 245  --    Basic Metabolic Panel: Recent Labs  Lab 11/28/17 1139 11/28/17 1301  NA 126* 128*  K 4.0 4.3  CL 94* 90*  CO2 22  --   GLUCOSE 92 94  BUN 17 15  CREATININE 1.57* 1.70*  CALCIUM 9.0  --    GFR: Estimated Creatinine Clearance: 65.2 mL/min (A) (by C-G formula based on SCr of 1.7 mg/dL (H)). Liver Function Tests: Recent Labs  Lab 11/28/17 1139  AST 25  ALT 25  ALKPHOS 84  BILITOT 1.7*  PROT 7.2  ALBUMIN 3.3*   No results for input(s): LIPASE, AMYLASE in the last 168 hours. No results for input(s):  AMMONIA in the last 168 hours. Coagulation Profile: Recent Labs  Lab 11/28/17 1656  INR 1.29   Cardiac Enzymes: No results for input(s): CKTOTAL, CKMB, CKMBINDEX, TROPONINI in the last 168 hours. BNP (last 3 results) No results for input(s): PROBNP in the last 8760 hours. HbA1C: No results for input(s): HGBA1C in the last 72 hours. CBG: No results for input(s): GLUCAP in the last 168 hours. Lipid Profile: No results for input(s): CHOL, HDL, LDLCALC, TRIG, CHOLHDL, LDLDIRECT in the last 72 hours. Thyroid Function Tests: No results for input(s): TSH, T4TOTAL, FREET4, T3FREE, THYROIDAB in the last 72 hours. Anemia Panel: No results for input(s): VITAMINB12, FOLATE, FERRITIN, TIBC, IRON, RETICCTPCT in the last 72 hours. Urine analysis:    Component Value Date/Time   COLORURINE YELLOW 09/16/2010 1440   APPEARANCEUR CLEAR 09/16/2010 1440   LABSPEC 1.016 09/16/2010 1440   PHURINE 6.5 09/16/2010 1440   GLUCOSEU NEGATIVE 01/11/2007 1216   HGBUR NEGATIVE 09/16/2010 1440   BILIRUBINUR NEGATIVE 09/16/2010 1440   KETONESUR NEGATIVE 09/16/2010 1440   PROTEINUR NEGATIVE 09/16/2010 1440   UROBILINOGEN 1.0 09/16/2010 1440   NITRITE NEGATIVE 09/16/2010 1440   LEUKOCYTESUR  09/16/2010 1440    NEGATIVE MICROSCOPIC NOT DONE ON URINES WITH NEGATIVE PROTEIN, BLOOD, LEUKOCYTES, NITRITE, OR GLUCOSE <1000 mg/dL.    Radiological Exams on Admission: Ct Pelvis W Contrast  Result Date: 11/28/2017 CLINICAL DATA:  Scrotal swelling, perineal abscess. EXAM: CT PELVIS WITH CONTRAST TECHNIQUE: Multidetector CT imaging of the pelvis was performed using the standard protocol following the bolus administration of intravenous contrast. CONTRAST:  14mL ISOVUE-300 IOPAMIDOL (ISOVUE-300) INJECTION 61% COMPARISON:  None. FINDINGS: Urinary Tract:  No abnormality visualized. Bowel:  Unremarkable visualized pelvic bowel loops. Vascular/Lymphatic: Mildly enlarged right inguinal lymph nodes are noted which most likely are  inflammatory in etiology. No significant vascular abnormality seen. Reproductive: No mass is noted. There is noted significant wall thickening and edema involving the scrotum. Other: Inflammatory changes are seen in the soft tissues extending from the right side of the scrotum into the right perineal tissues and right inguinal region consistent with cellulitis. No abscess or fluid collection is noted. No soft tissue gas is seen at this time. Musculoskeletal: No suspicious bone lesions identified. IMPRESSION: Severe wall thickening and edema is seen involving the scrotum. Inflammatory changes are seen extending from the scrotum and to the right perineal and inguinal soft tissues. No abscess or soft tissue gas is seen at this time. Enlarged right inguinal adenopathy is noted which most likely is inflammatory in etiology. These findings are consistent with cellulitis, but early or developing Fournier's gangrene cannot be excluded. Electronically Signed   By: Marijo Conception, M.D.   On: 11/28/2017 15:12   Dg Chest [facility]  1 View  Result Date: 11/28/2017 CLINICAL DATA:  Weakness, sepsis EXAM: PORTABLE CHEST 1 VIEW COMPARISON:  09/16/2010 FINDINGS: Cardiomegaly with vascular congestion. Interstitial prominence may reflect early interstitial edema. No confluent opacities or effusions. No acute bony abnormality. IMPRESSION: Cardiomegaly with vascular congestion and possible early interstitial edema. Electronically Signed   By: Rolm Baptise M.D.   On: 11/28/2017 17:42   EKG: Independently reviewed. normal sinus rhythm, nonspecific ST and T waves changes.  Assessment/Plan 1.  Sepsis. ?  gangrene of scrotum versus cellulitis of the scrotum. Currently received IV fluids.  We will continue with IV fluids for now.  Continue IV vancomycin, IV Zosyn, IV clindamycin. Urology consulted. CT scan makes a comment about possible early Fournier gangrene.  Patient will be kept n.p.o. for possible OR evaluation. Also holding DVT  prophylaxis for the same reason. Once patient's sepsis resolved and only surgical needs are the only inpatient care that the patient will require will request urology to transfer care to their service.  2.  Hypertension. Holding blood pressure medications for now. Monitoring closely in the stepdown unit given his potential for further worsening.  3. Hyponatremia. Sodium level low.  Likely in response to sepsis.  Receiving IV hydration.  Monitor.  4.  Cardiomegaly. Check echocardiogram.  Nutrition: npo DVT Prophylaxis: subcutaneous Heparin  Advance goals of care discussion: full code   Consults: urology   Family Communication: family was present at bedside, at the time of interview.  Opportunity was given to ask question and all questions were answered satisfactorily.  Disposition: Admitted as inpatient, step-down unit. Likely to be discharged home, in 3-4 days.  Author: Berle Mull, MD [facility] 11/28/2017  If 7PM-7AM, please contact night-coverage [email or web address] Password [email or web address]

## 2017-11-28 NOTE — Progress Notes (Signed)
Pharmacy Note:   Initial antibiotic(s) regimen of Vancomycin and Zosyn ordered by EDP to treat scrotal cellulitis.  CrCl cannot be calculated (Patient's most recent lab result is older than the maximum 21 days allowed.).   No Known Allergies  Vitals:   11/28/17 1047  BP: (!) 145/102  Pulse: (!) 116  Resp: 20  Temp: (!) 100.8 F (38.2 C)  SpO2: 97%    Anti-infectives (From admission, onward)   Start     Dose/Rate Route Frequency Ordered Stop   11/28/17 1215  piperacillin-tazobactam (ZOSYN) IVPB 3.375 g     3.375 g 100 mL/hr over 30 Minutes Intravenous  Once 11/28/17 1202     11/28/17 1215  vancomycin (VANCOCIN) IVPB 1000 mg/200 mL premix     1,000 mg 200 mL/hr over 60 Minutes Intravenous  Once 11/28/17 1202        Plan: Initial dose(s) of Vancomycin 1gm, Zosyn 3.357gm  X 1 ordered by ED PA Patient 121 kg, initial loading dose of 2500mg  appropriate if Vancomycin continued F/U admission orders for further dosing if therapy continued.  Minda Ditto, [facility] 11/28/2017 12:10 PM

## 2017-11-28 NOTE — ED Notes (Signed)
Pt was just seen by urologist, going to stay IN ED for 30 minutes and will go to OR from ED then to ICU

## 2017-11-28 NOTE — Op Note (Signed)
Preoperative diagnosis: Perineal/scrotal abscess  Postoperative diagnosis: Perineal/scrotal abscess  Procedure: Incision and drainage of perineal/scrotal abscess (4 cm)  Surgeon: Pryor Curia MD  Anesthesia: General  Complications: None  EBL: Minimal  Intraoperative findings: There was noted to be an abscess cavity along the perineum and extending laterally next to the right hemiscrotum toward the right inguinal region.  Specimens: Aerobic and anaerobic culture sent to the microbiology lab  Indication: This is a 57 year old gentleman who presented to the emergency department with 1 week of increased scrotal swelling and pain in the perineum with purulent drainage noted from the perineum.  CT imaging did not reveal any clear abscess cavity his exam was consistent with an abscess.  He was not felt to have a necrotizing infection.  It was recommended that he proceed to the operating room for incision and drainage of his abscess.  We discussed the potential risks, complications, and the expected recovery process associated with the above procedure.  Informed consent was obtained.  Description of procedure: The patient was taken to the operating room and a general anesthetic was administered.  He was given preoperative antibiotics, placed in the dorsal lithotomy position, and prepped and draped in the usual sterile fashion.  A preoperative timeout was performed.  Examination revealed an area of purulent drainage along the right perineum.  This was incised anteriorly and with digital inspection, this area was open.  Gross purulent material was noted.  An aerobic and anaerobic culture were obtained.  It was felt that this incision needed to be open to approximately 4 cm.  The abscess did track up along the inguinal region just lateral to the right hemiscrotum.  This is not appeared to be involving the actual scrotum however.  The wound was then copiously irrigated with double antibiotic  solution.  The wound was packed with a wet-to-dry Kerlix dressing with normal saline.  The patient tolerated the procedure well without complications.  He was able to be transferred to the recovery unit in satisfactory condition.

## 2017-11-28 NOTE — ED Provider Notes (Signed)
[facility] DEPT Provider Note   CSN: [id number] Arrival date & time: 11/28/17  1028     History   Chief Complaint Chief Complaint  Patient presents with  . Groin Swelling    HPI [name] is a 57 y.o. male the past medical history of hypertension who presents the emergency department today for scrotal swelling and fever.  Patient notes over the last 1 week he has been having increasing swelling, redness and heat to his scrotal area.  Notes that 4 days ago he started developing fever and chills.  Patient has not been taking anything for symptoms.  He denies history of prior.  He denies history of immunocompromise including HIV, diabetes, chronic steroid use or other immunocompromise states.  The patient denies any URI symptoms, chest pain, shortness of breath, cough, abdominal pain, nausea/vomiting/diarrhea, urinary frequency, urinary urgency, dysuria, hematuria or flank pain.  Patient also reports that this morning he had a large blood-streaked bowel movement.  He reports that he been suffering from problems with constipation recently with very small hard stools leading up to the event.  Patient denies any alcohol use, chronic NSAID use or history of GI bleeds.  He denies any associated abdominal pain or symptoms with this.  No melena or hematochezia.  He denies any bright red blood in bowel movements or in commode.  Patient is not on any anticoagulation medicine.  No lightheadedness, chest pain or shortness of breath.  HPI  Past Medical History:  Diagnosis Date  . Hypertension     There are no active problems to display for this patient.   Past Surgical History:  Procedure Laterality Date  . BACK SURGERY    . c1-c7 surgery    . kidney removed          Home Medications    Prior to Admission medications   Medication Sig Start Date End Date Taking? Authorizing Provider  methocarbamol (ROBAXIN) 500 MG tablet Take 1 tablet (500 mg total) by  mouth 2 (two) times daily. 09/12/13   Junius Creamer, NP  oxyCODONE-acetaminophen (PERCOCET/ROXICET) 5-325 MG per tablet Take 1 tablet by mouth every 4 (four) hours as needed for severe pain. 09/12/13   Junius Creamer, NP    Family History No family history on file.  Social History Social History   Tobacco Use  . Smoking status: Never Smoker  Substance Use Topics  . Alcohol use: No  . Drug use: Not on file     Allergies   Patient has no known allergies.   Review of Systems Review of Systems  All other systems reviewed and are negative.    Physical Exam Updated Vital Signs BP (!) 145/102 (BP Location: Left Arm)   Pulse (!) 116   Temp (!) 100.8 F (38.2 C) (Oral)   Resp 20   Ht 6' (1.829 m)   Wt 121.1 kg (267 lb)   SpO2 97%   BMI 36.21 kg/m   Physical Exam  Constitutional: He appears well-developed and well-nourished.  HENT:  Head: Normocephalic and atraumatic.  Right Ear: External ear normal.  Left Ear: External ear normal.  Nose: Nose normal.  Mouth/Throat: Uvula is midline, oropharynx is clear and moist and mucous membranes are normal. No tonsillar exudate.  Eyes: Pupils are equal, round, and reactive to light. Right eye exhibits no discharge. Left eye exhibits no discharge. No scleral icterus.  Neck: Trachea normal. Neck supple. No spinous process tenderness present. No neck rigidity. Normal range of motion  present.  Cardiovascular: Normal rate, regular rhythm and intact distal pulses.  No murmur heard. Pulses:      Radial pulses are 2+ on the right side, and 2+ on the left side.       Dorsalis pedis pulses are 2+ on the right side, and 2+ on the left side.       Posterior tibial pulses are 2+ on the right side, and 2+ on the left side.  No lower extremity swelling or edema. Calves symmetric in size bilaterally.  Pulmonary/Chest: Effort normal and breath sounds normal. He exhibits no tenderness.  Abdominal: Soft. Bowel sounds are normal. He exhibits no distension  and no pulsatile liver. There is no tenderness. There is no rigidity, no rebound, no guarding and no CVA tenderness.  Genitourinary:  Genitourinary Comments: Chaperone was present Patient with large, cantaloupe sized, distended scrotum with erythema and heat.  Along the right scrotum there is induration that tracks down the perineum and into the patient's rectum.  Along the right perineum there is a area with purulent drainage.  No dark or dusky skin. No external fissure's palpated or examined. No external hemorrhoids noted. No induration of the skin or swelling. Digital Rectal Exam reveals sphincter with good tone. No masses palpated. Stool color is brown with no overt blood or melena.  Musculoskeletal: He exhibits no edema.  Lymphadenopathy:    He has no cervical adenopathy.  Neurological: He is alert.  Skin: Skin is warm and dry. No rash noted. He is not diaphoretic.  Psychiatric: He has a normal mood and affect.  Nursing note and vitals reviewed.    ED Treatments / Results  Labs (all labs ordered are listed, but only abnormal results are displayed) Labs Reviewed  COMPREHENSIVE METABOLIC PANEL - Abnormal; Notable for the following components:      Result Value   Sodium 126 (*)    Chloride 94 (*)    Creatinine, Ser 1.57 (*)    Albumin 3.3 (*)    Total Bilirubin 1.7 (*)    GFR calc non Af Amer 48 (*)    GFR calc Af Amer 55 (*)    All other components within normal limits  CBC WITH DIFFERENTIAL/PLATELET - Abnormal; Notable for the following components:   WBC 24.1 (*)    Neutro Abs 20.4 (*)    Monocytes Absolute 2.7 (*)    All other components within normal limits  I-STAT CHEM 8, ED - Abnormal; Notable for the following components:   Sodium 128 (*)    Chloride 90 (*)    Creatinine, Ser 1.70 (*)    All other components within normal limits  CULTURE, BLOOD (ROUTINE X 2)  CULTURE, BLOOD (ROUTINE X 2)  AEROBIC CULTURE (SUPERFICIAL SPECIMEN)  URINALYSIS, ROUTINE W REFLEX  MICROSCOPIC  OCCULT BLOOD X 1 CARD TO LAB, STOOL  I-STAT CG4 LACTIC ACID, ED  I-STAT CG4 LACTIC ACID, ED  I-STAT CG4 LACTIC ACID, ED  POC OCCULT BLOOD, ED    EKG EKG Interpretation  Date/Time:  Wednesday November 28 2017 12:34:46 EDT Ventricular Rate:  95 PR Interval:    QRS Duration: 123 QT Interval:  346 QTC Calculation: 435 R Axis:   82 Text Interpretation:  Sinus rhythm Right bundle branch block Borderline ST elevation, lateral leads No significant change since last tracing Confirmed by Blanchie Dessert [phone]) on 11/28/2017 1:20:10 PM   Radiology Ct Pelvis W Contrast  Result Date: 11/28/2017 CLINICAL DATA:  Scrotal swelling, perineal abscess. EXAM: CT PELVIS WITH CONTRAST  TECHNIQUE: Multidetector CT imaging of the pelvis was performed using the standard protocol following the bolus administration of intravenous contrast. CONTRAST:  151mL ISOVUE-300 IOPAMIDOL (ISOVUE-300) INJECTION 61% COMPARISON:  None. FINDINGS: Urinary Tract:  No abnormality visualized. Bowel:  Unremarkable visualized pelvic bowel loops. Vascular/Lymphatic: Mildly enlarged right inguinal lymph nodes are noted which most likely are inflammatory in etiology. No significant vascular abnormality seen. Reproductive: No mass is noted. There is noted significant wall thickening and edema involving the scrotum. Other: Inflammatory changes are seen in the soft tissues extending from the right side of the scrotum into the right perineal tissues and right inguinal region consistent with cellulitis. No abscess or fluid collection is noted. No soft tissue gas is seen at this time. Musculoskeletal: No suspicious bone lesions identified. IMPRESSION: Severe wall thickening and edema is seen involving the scrotum. Inflammatory changes are seen extending from the scrotum and to the right perineal and inguinal soft tissues. No abscess or soft tissue gas is seen at this time. Enlarged right inguinal adenopathy is noted which most likely is  inflammatory in etiology. These findings are consistent with cellulitis, but early or developing Fournier's gangrene cannot be excluded. Electronically Signed   By: Marijo Conception, M.D.   On: 11/28/2017 15:12    Procedures Procedures (including critical care time) CRITICAL CARE Performed by: Jillyn Ledger   Total critical care time: 60 minutes  Critical care time was exclusive of separately billable procedures and treating other patients.  Critical care was necessary to treat or prevent imminent or life-threatening deterioration.  Critical care was time spent personally by me on the following activities: development of treatment plan with patient and/or surrogate as well as nursing, discussions with consultants, evaluation of patient's response to treatment, examination of patient, obtaining history from patient or surrogate, ordering and performing treatments and interventions, ordering and review of laboratory studies, ordering and review of radiographic studies, pulse oximetry and re-evaluation of patient's condition.   Medications Ordered in ED Medications  iopamidol (ISOVUE-300) 61 % injection (has no administration in time range)  morphine 4 MG/ML injection 4 mg (has no administration in time range)  piperacillin-tazobactam (ZOSYN) IVPB 3.375 g (0 g Intravenous Stopped 11/28/17 1413)  vancomycin (VANCOCIN) IVPB 1000 mg/200 mL premix (0 mg Intravenous Stopped 11/28/17 1522)  sodium chloride 0.9 % bolus 3,633 mL (3,633 mLs Intravenous New Bag/Given 11/28/17 1337)  iopamidol (ISOVUE-300) 61 % injection 100 mL (100 mLs Intravenous Contrast Given 11/28/17 1435)     Initial Impression / Assessment and Plan / ED Course  I have reviewed the triage vital signs and the nursing notes.  Pertinent labs & imaging results that were available during my care of the patient were reviewed by me and considered in my medical decision making (see chart for details).     57 year old male with a  one-week history of increased scrotal swelling, redness, heat with associated fevers at home.  On presentation patient is noted to be febrile and tachycardic.  Scrotal sac with overlying cellulitic-like changes induration that extends down into the perineum as well as the buttocks.  There is a draining abscess in the perineum on the right.  Wound cultures were obtained.  There is concern for sepsis given patient's vital signs and exam findings.  Code sepsis called.  Patient covered with broad-spectrum antibiotics and given 30 cc/kg bolus.  Will obtain CT scan of the pelvis to rule out Fournier's. Patient denies any history of immunocompromise.    Patient's labs are with  leukocytosis of 24.1 with a shift of absolute neutrophils of 20.4.  Lactic acid within normal limits.  Mild hyponatremia.  Creatinine 1.7.  Patient's baseline 1.57.  CT scan shows cellulitic-like changes without obvious abscess or soft tissue gas.  There is concern for early or developing Fournier's gangrene.  Consulted urology, Dr. Alinda Money.  He recommended admission to the hospitalist service and he will consult on the patient today.  He requested the patient be made n.p.o.  Place consult to hospitalist.  Patient also reported that he had one red streak bowel movement today.  Patient's rectal exam reassuring as above.  No occult blood.  Stool guaiac negative.  No anemia.  No abdominal tenderness.  No hypotension.  No further workup indicated at this time.  Appreciate Dr. Posey Pronto of hospitalist service for seeing and admitting the patient.   Final Clinical Impressions(s) / ED Diagnoses   Final diagnoses:  Scrotal swelling  Cellulitis of male genitalia  Fever and chills  SIRS (systemic inflammatory response syndrome) [facility])    ED Discharge Orders    None       Lorelle Gibbs 11/28/17 1609    Blanchie Dessert, MD 11/28/17 2141

## 2017-11-28 NOTE — Progress Notes (Signed)
Pharmacy Antibiotic Note  [name] is a 57 y.o. male admitted on 11/28/2017 with possible Fournier's gangrene.  Pharmacy has been consulted for vancomycin and zosyn dosing. First doses of vancomycin and zosyn given in ED. Clindamycin started for antitoxin effect  Today, 11/28/2017  WBC elevated  Renal: AKI noted  Febrile  Culture taken from draining abscess  Plan:  Vancomycin 1500 mg IV q24h (give dose this evening to complete loading dose)   Zosyn 3.375gm IV q8h over 4h infusion   Daily SCr for increase risk of nephrotoxicity with above regimen  Check vancomycin levels based on length of therapy  Height: 6' (182.9 cm) Weight: 267 lb (121.1 kg) IBW/kg (Calculated) : 77.6  Temp (24hrs), Avg:100.8 F (38.2 C), Min:100.8 F (38.2 C), Max:100.8 F (38.2 C)  Recent Labs  Lab 11/28/17 1139 11/28/17 1145 11/28/17 1301  WBC 24.1*  --   --   CREATININE 1.57*  --  1.70*  LATICACIDVEN  --  0.92 1.11    Estimated Creatinine Clearance: 65.2 mL/min (A) (by C-G formula based on SCr of 1.7 mg/dL (H)).    No Known Allergies  Antimicrobials this admission: 4/24 vancomycin >> 4/24 zosyn >> 4/24 clindamycin >>  Dose adjustments this admission:  Microbiology results: 4/24 BCx:  4/24 wound Cx:   Thank you for allowing pharmacy to be a part of this patient's care.  Doreene Eland, PharmD, BCPS.   Pager: [phone] 11/28/2017 4:36 PM

## 2017-11-28 NOTE — Consult Note (Signed)
Urology Consult   Physician requesting consult: Dr. Posey Pronto  Reason for consult: Scrotal erythema and edema with possible abscess  History of Present Illness: [name] is a 57 y.o. patient with a history of obesity and hypertension.  He noted a small knot under his right hemiscrotum approximately 1 week ago.  This was painful and tender.  This subsequently progressed and he developed scrotal edema and erythema with tenderness.  He began developing fever over the last 24-48 hours.  He denies a history of diabetes.  He denies any prior similar episodes.  He denies difficulty emptying his bladder or problems with bowel movements although he did have some recent constipation that was treated with a suppository earlier.  He presented to the emergency department and was noted to be febrile.  There was concern for possible Fournier's gangrene.  He underwent a CT scan of the pelvis and upper thighs that demonstrated stranding without any clear fluid collection.  However, he was noted to have an area of purulent drainage under the right hemiscrotum which has been cultured.  He is currently on clindamycin, vancomycin, and Zosyn.  He is to be admitted to the ICU for medical management.  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    Past Medical History:  Diagnosis Date  . Hypertension     Past Surgical History:  Procedure Laterality Date  . BACK SURGERY    . c1-c7 surgery    . kidney removed      Medications:  Home meds:  Current Meds  Medication Sig  . amLODipine (NORVASC) 5 MG tablet Take 5 mg by mouth daily.  Marland Kitchen aspirin EC 81 MG tablet Take 162-240 mg by mouth every 6 (six) hours as needed for mild pain or moderate pain.  . Naproxen Sod-diphenhydrAMINE (ALEVE PM) 220-25 MG TABS Take 1 tablet by mouth at bedtime as needed (for pain/sleep).    Scheduled Meds: . iopamidol      . sodium chloride flush  3 mL Intravenous Q12H    Continuous Infusions: . clindamycin (CLEOCIN) IV    . piperacillin-tazobactam (ZOSYN)  IV    . vancomycin 1,500 mg (11/28/17 1753)   PRN Meds:.acetaminophen **OR** acetaminophen, HYDROcodone-acetaminophen, ondansetron **OR** ondansetron (ZOFRAN) IV  Allergies: No Known Allergies  History reviewed. No pertinent family history.  Social History:  reports that he has never smoked. He has never used smokeless tobacco. He reports that he does not drink alcohol. His drug history is not on file.  ROS: A complete review of systems was performed.  All systems are negative except for pertinent findings as noted.  Physical Exam:  Vital signs in last 24 hours: Temp:  [100.8 F (38.2 C)] 100.8 F (38.2 C) (04/24 1047) Pulse Rate:  [89-116] 92 (04/24 1711) Resp:  [11-29] 18 (04/24 1730) BP: (131-175)/(82-119) 156/98 (04/24 1730) SpO2:  [96 %-100 %] 100 % (04/24 1711) Weight:  [121.1 kg (267 lb)] 121.1 kg (267 lb) (04/24 1047) Constitutional:  Alert and oriented, No acute distress Cardiovascular: Regular rate and rhythm, No JVD Respiratory: Normal respiratory effort, Lungs clear bilaterally GI: Abdomen is soft, nontender, nondistended, no abdominal masses, obese. Genitourinary: No CVAT.  He does have significant erythema and edema of the scrotum with increased erythema of the right side compared to left with extension into the right inguinal region.  His scrotum and perineum is tender.  He does have induration extending toward the right side of the perineum with  a small area that is openly draining purulent fluid. Rectal: Normal sphincter tone, no rectal masses, prostate is non tender and without nodularity.  No clear evidence of perirectal abscess. Lymphatic: No lymphadenopathy Neurologic: Grossly intact, no focal deficits Psychiatric: Normal mood and affect  Laboratory Data:  Recent Labs    11/28/17 1139 11/28/17 1301  WBC 24.1*  --   HGB 15.6 16.0  HCT 44.1 47.0  PLT 245  --      Recent Labs    11/28/17 1139 11/28/17 1301  NA 126* 128*  K 4.0 4.3  CL 94* 90*  GLUCOSE 92 94  BUN 17 15  CALCIUM 9.0  --   CREATININE 1.57* 1.70*     Results for orders placed or performed during the hospital encounter of 11/28/17 (from the past 24 hour(s))  Comprehensive metabolic panel     Status: Abnormal   Collection Time: 11/28/17 11:39 AM  Result Value Ref Range   Sodium 126 (L) 135 - 145 mmol/L   Potassium 4.0 3.5 - 5.1 mmol/L   Chloride 94 (L) 101 - 111 mmol/L   CO2 22 22 - 32 mmol/L   Glucose, Bld 92 65 - 99 mg/dL   BUN 17 6 - 20 mg/dL   Creatinine, Ser 1.57 (H) 0.61 - 1.24 mg/dL   Calcium 9.0 8.9 - 10.3 mg/dL   Total Protein 7.2 6.5 - 8.1 g/dL   Albumin 3.3 (L) 3.5 - 5.0 g/dL   AST 25 15 - 41 U/L   ALT 25 17 - 63 U/L   Alkaline Phosphatase 84 38 - 126 U/L   Total Bilirubin 1.7 (H) 0.3 - 1.2 mg/dL   GFR calc non Af Amer 48 (L) >60 mL/min   GFR calc Af Amer 55 (L) >60 mL/min   Anion gap 10 5 - 15  CBC with Differential     Status: Abnormal   Collection Time: 11/28/17 11:39 AM  Result Value Ref Range   WBC 24.1 (H) 4.0 - 10.5 K/uL   RBC 4.83 4.22 - 5.81 MIL/uL   Hemoglobin 15.6 13.0 - 17.0 g/dL   HCT 44.1 39.0 - 52.0 %   MCV 91.3 78.0 - 100.0 fL   MCH 32.3 26.0 - 34.0 pg   MCHC 35.4 30.0 - 36.0 g/dL   RDW 12.5 11.5 - 15.5 %   Platelets 245 150 - 400 K/uL   Neutrophils Relative % 85 %   Neutro Abs 20.4 (H) 1.7 - 7.7 K/uL   Lymphocytes Relative 4 %   Lymphs Abs 0.9 0.7 - 4.0 K/uL   Monocytes Relative 11 %   Monocytes Absolute 2.7 (H) 0.1 - 1.0 K/uL   Eosinophils Relative 0 %   Eosinophils Absolute 0.0 0.0 - 0.7 K/uL   Basophils Relative 0 %   Basophils Absolute 0.0 0.0 - 0.1 K/uL  I-Stat CG4 Lactic Acid, ED     Status: None   Collection Time: 11/28/17 11:45 AM  Result Value Ref Range   Lactic Acid, Venous 0.92 0.5 - 1.9 mmol/L  I-Stat CG4 Lactic Acid, ED  (not at  [facility])     Status: None   Collection Time: 11/28/17  1:01 PM  Result Value Ref  Range   Lactic Acid, Venous 1.11 0.5 - 1.9 mmol/L  I-Stat Chem 8, ED     Status: Abnormal   Collection Time: 11/28/17  1:01 PM  Result Value Ref Range   Sodium 128 (L) 135 - 145 mmol/L   Potassium 4.3 3.5 -  5.1 mmol/L   Chloride 90 (L) 101 - 111 mmol/L   BUN 15 6 - 20 mg/dL   Creatinine, Ser 1.70 (H) 0.61 - 1.24 mg/dL   Glucose, Bld 94 65 - 99 mg/dL   Calcium, Ion 1.17 1.15 - 1.40 mmol/L   TCO2 25 22 - 32 mmol/L   Hemoglobin 16.0 13.0 - 17.0 g/dL   HCT 47.0 39.0 - 52.0 %  POC occult blood, ED     Status: None   Collection Time: 11/28/17  1:22 PM  Result Value Ref Range   Fecal Occult Bld NEGATIVE NEGATIVE   No results found for this or any previous visit (from the past 240 hour(s)).  Renal Function: Recent Labs    11/28/17 1139 11/28/17 1301  CREATININE 1.57* 1.70*   Estimated Creatinine Clearance: 65.2 mL/min (A) (by C-G formula based on SCr of 1.7 mg/dL (H)).  Radiologic Imaging: Ct Pelvis W Contrast  Result Date: 11/28/2017 CLINICAL DATA:  Scrotal swelling, perineal abscess. EXAM: CT PELVIS WITH CONTRAST TECHNIQUE: Multidetector CT imaging of the pelvis was performed using the standard protocol following the bolus administration of intravenous contrast. CONTRAST:  15mL ISOVUE-300 IOPAMIDOL (ISOVUE-300) INJECTION 61% COMPARISON:  None. FINDINGS: Urinary Tract:  No abnormality visualized. Bowel:  Unremarkable visualized pelvic bowel loops. Vascular/Lymphatic: Mildly enlarged right inguinal lymph nodes are noted which most likely are inflammatory in etiology. No significant vascular abnormality seen. Reproductive: No mass is noted. There is noted significant wall thickening and edema involving the scrotum. Other: Inflammatory changes are seen in the soft tissues extending from the right side of the scrotum into the right perineal tissues and right inguinal region consistent with cellulitis. No abscess or fluid collection is noted. No soft tissue gas is seen at this time.  Musculoskeletal: No suspicious bone lesions identified. IMPRESSION: Severe wall thickening and edema is seen involving the scrotum. Inflammatory changes are seen extending from the scrotum and to the right perineal and inguinal soft tissues. No abscess or soft tissue gas is seen at this time. Enlarged right inguinal adenopathy is noted which most likely is inflammatory in etiology. These findings are consistent with cellulitis, but early or developing Fournier's gangrene cannot be excluded. Electronically Signed   By: Marijo Conception, M.D.   On: 11/28/2017 15:12   Dg Chest Port 1 View  Result Date: 11/28/2017 CLINICAL DATA:  Weakness, sepsis EXAM: PORTABLE CHEST 1 VIEW COMPARISON:  09/16/2010 FINDINGS: Cardiomegaly with vascular congestion. Interstitial prominence may reflect early interstitial edema. No confluent opacities or effusions. No acute bony abnormality. IMPRESSION: Cardiomegaly with vascular congestion and possible early interstitial edema. Electronically Signed   By: Rolm Baptise M.D.   On: 11/28/2017 17:42    I independently reviewed the above imaging studies.  Impression/Recommendation 1.  Scrotal/perineal abscess: He appears to have a localized area where this began consistent with his history and his exam.  I have recommended that he proceed to the operating room this evening for incision and drainage with possible debridement if necessary.  However, there are no physical exam findings or imaging findings that would suggest a necrotizing infection.  Additional cultures will be obtained in the operating room.  He will continue on broad-spectrum antibiotics and sepsis protocol in the ICU postoperatively.  We discussed this procedure in detail including the potential risks, complications, and the expected recovery process.  He gives informed consent to proceed.  Buffey Zabinski,LES 11/28/2017, 5:56 PM    Pryor Curia MD  CC: Dr. Posey Pronto

## 2017-11-28 NOTE — Anesthesia Preprocedure Evaluation (Addendum)
Anesthesia Evaluation  Patient identified by MRN, date of birth, ID band Patient awake    Reviewed: Allergy & Precautions, NPO status , Patient's Chart, lab work & pertinent test results  Airway Mallampati: II  TM Distance: >3 FB Neck ROM: Full    Dental no notable dental hx. (+) Teeth Intact   Pulmonary neg pulmonary ROS,    Pulmonary exam normal breath sounds clear to auscultation       Cardiovascular hypertension, Pt. on medications Normal cardiovascular exam Rhythm:Regular Rate:Normal  EKG- NSR, RBBB pattern, unchanged from previous tracing   Neuro/Psych negative neurological ROS  negative psych ROS   GI/Hepatic negative GI ROS, Neg liver ROS,   Endo/Other  Obesity  Renal/GU negative Renal ROS   Srcotal and perineal abscess for last 7 days    Musculoskeletal Hx/o 4 previous low back surgeries and one c-spine surgery   Abdominal (+) + obese,   Peds  Hematology negative hematology ROS (+)   Anesthesia Other Findings   Reproductive/Obstetrics                           Anesthesia Physical Anesthesia Plan  ASA: II and emergent  Anesthesia Plan: General   Post-op Pain Management:    Induction: Intravenous  PONV Risk Score and Plan:   Airway Management Planned: LMA  Additional Equipment:   Intra-op Plan:   Post-operative Plan: Extubation in OR  Informed Consent: I have reviewed the patients History and Physical, chart, labs and discussed the procedure including the risks, benefits and alternatives for the proposed anesthesia with the patient or authorized representative who has indicated his/her understanding and acceptance.   Dental advisory given  Plan Discussed with: CRNA, Anesthesiologist and Surgeon  Anesthesia Plan Comments:         Anesthesia Quick Evaluation

## 2017-11-29 ENCOUNTER — Encounter (HOSPITAL_COMMUNITY): Payer: Self-pay | Admitting: Urology

## 2017-11-29 ENCOUNTER — Inpatient Hospital Stay (HOSPITAL_COMMUNITY): Payer: Medicare Other

## 2017-11-29 DIAGNOSIS — A419 Sepsis, unspecified organism: Principal | ICD-10-CM

## 2017-11-29 LAB — BASIC METABOLIC PANEL
Anion gap: 8 (ref 5–15)
Anion gap: 10 (ref 5–15)
Anion gap: 11 (ref 5–15)
BUN: 17 mg/dL (ref 6–20)
BUN: 20 mg/dL (ref 6–20)
BUN: 19 mg/dL (ref 6–20)
CALCIUM: 8.4 mg/dL — AB (ref 8.9–10.3)
CALCIUM: 8.5 mg/dL — AB (ref 8.9–10.3)
CALCIUM: 8.5 mg/dL — AB (ref 8.9–10.3)
CO2: 21 mmol/L — ABNORMAL LOW (ref 22–32)
CO2: 21 mmol/L — ABNORMAL LOW (ref 22–32)
CO2: 22 mmol/L (ref 22–32)
Chloride: 100 mmol/L — ABNORMAL LOW (ref 101–111)
Chloride: 98 mmol/L — ABNORMAL LOW (ref 101–111)
Chloride: 99 mmol/L — ABNORMAL LOW (ref 101–111)
Creatinine, Ser: 1.3 mg/dL — ABNORMAL HIGH (ref 0.61–1.24)
Creatinine, Ser: 1.35 mg/dL — ABNORMAL HIGH (ref 0.61–1.24)
Creatinine, Ser: 1.33 mg/dL — ABNORMAL HIGH (ref 0.61–1.24)
GFR calc Af Amer: >60 mL/min (ref >60)
GFR calc Af Amer: >60 mL/min (ref >60)
GFR calc Af Amer: >60 mL/min (ref >60)
GFR, EST NON AFRICAN AMERICAN: 60 mL/min — AB (ref >60)
GFR, EST NON AFRICAN AMERICAN: 57 mL/min — AB (ref >60)
GFR, EST NON AFRICAN AMERICAN: 58 mL/min — AB (ref >60)
GLUCOSE: 162 mg/dL — AB (ref 65–99)
GLUCOSE: 132 mg/dL — AB (ref 65–99)
Glucose, Bld: 153 mg/dL — ABNORMAL HIGH (ref 65–99)
Potassium: 4.4 mmol/L (ref 3.5–5.1)
Potassium: 4.3 mmol/L (ref 3.5–5.1)
Potassium: 4.5 mmol/L (ref 3.5–5.1)
Sodium: 129 mmol/L — ABNORMAL LOW (ref 135–145)
Sodium: 129 mmol/L — ABNORMAL LOW (ref 135–145)
Sodium: 132 mmol/L — ABNORMAL LOW (ref 135–145)

## 2017-11-29 LAB — CBC WITH DIFFERENTIAL/PLATELET
Basophils Absolute: 0 10*3/uL (ref 0.0–0.1)
Basophils Relative: 0 %
Eosinophils Absolute: 0 10*3/uL (ref 0.0–0.7)
Eosinophils Relative: 0 %
HCT: 39.9 % (ref 39.0–52.0)
Hemoglobin: 13.7 g/dL (ref 13.0–17.0)
LYMPHS ABS: 0.6 10*3/uL — AB (ref 0.7–4.0)
LYMPHS PCT: 3 %
MCH: 31.6 pg (ref 26.0–34.0)
MCHC: 34.3 g/dL (ref 30.0–36.0)
MCV: 91.9 fL (ref 78.0–100.0)
MONOS PCT: 4 %
Monocytes Absolute: 1 10*3/uL (ref 0.1–1.0)
Neutro Abs: 21.3 10*3/uL — ABNORMAL HIGH (ref 1.7–7.7)
Neutrophils Relative %: 93 %
PLATELETS: 235 10*3/uL (ref 150–400)
RBC: 4.34 MIL/uL (ref 4.22–5.81)
RDW: 12.8 % (ref 11.5–15.5)
WBC: 23 10*3/uL — AB (ref 4.0–10.5)

## 2017-11-29 LAB — COMPREHENSIVE METABOLIC PANEL
ALK PHOS: 71 U/L (ref 38–126)
ALT: 21 U/L (ref 17–63)
AST: 23 U/L (ref 15–41)
Albumin: 2.6 g/dL — ABNORMAL LOW (ref 3.5–5.0)
Anion gap: 9 (ref 5–15)
BUN: 15 mg/dL (ref 6–20)
CALCIUM: 8 mg/dL — AB (ref 8.9–10.3)
CHLORIDE: 98 mmol/L — AB (ref 101–111)
CO2: 18 mmol/L — ABNORMAL LOW (ref 22–32)
Creatinine, Ser: 1.27 mg/dL — ABNORMAL HIGH (ref 0.61–1.24)
Glucose, Bld: 146 mg/dL — ABNORMAL HIGH (ref 65–99)
Potassium: 4.9 mmol/L (ref 3.5–5.1)
Sodium: 125 mmol/L — ABNORMAL LOW (ref 135–145)
Total Bilirubin: 2.3 mg/dL — ABNORMAL HIGH (ref 0.3–1.2)
Total Protein: 6.2 g/dL — ABNORMAL LOW (ref 6.5–8.1)

## 2017-11-29 LAB — URINALYSIS, ROUTINE W REFLEX MICROSCOPIC
Bacteria, UA: NONE SEEN
Bilirubin Urine: NEGATIVE
Glucose, UA: NEGATIVE mg/dL
HGB URINE DIPSTICK: NEGATIVE
KETONES UR: 5 mg/dL — AB
LEUKOCYTES UA: NEGATIVE
NITRITE: NEGATIVE
PROTEIN: 30 mg/dL — AB
Specific Gravity, Urine: 1.017 (ref 1.005–1.030)
pH: 6 (ref 5.0–8.0)

## 2017-11-29 LAB — MRSA PCR SCREENING: MRSA by PCR: NEGATIVE

## 2017-11-29 LAB — MAGNESIUM: MAGNESIUM: 1.5 mg/dL — AB (ref 1.7–2.4)

## 2017-11-29 LAB — LACTIC ACID, PLASMA: LACTIC ACID, VENOUS: 1.1 mmol/L (ref 0.5–1.9)

## 2017-11-29 LAB — HIV ANTIBODY (ROUTINE TESTING W REFLEX): HIV Screen 4th Generation wRfx: NONREACTIVE

## 2017-11-29 MED ORDER — HYDRALAZINE HCL 20 MG/ML IJ SOLN
10.0000 mg | INTRAMUSCULAR | Status: DC | PRN
  Administered 2017-12-02: 10 mg via INTRAVENOUS
  Filled 2017-11-29: qty 1

## 2017-11-29 MED ORDER — PROMETHAZINE HCL 25 MG/ML IJ SOLN
12.5000 mg | Freq: Four times a day (QID) | INTRAMUSCULAR | Status: DC | PRN
  Administered 2017-11-29: 25 mg via INTRAVENOUS
  Filled 2017-11-29: qty 1

## 2017-11-29 MED ORDER — MAGNESIUM SULFATE 2 GM/50ML IV SOLN
2.0000 g | Freq: Once | INTRAVENOUS | Status: AC
  Administered 2017-11-29: 2 g via INTRAVENOUS
  Filled 2017-11-29: qty 50

## 2017-11-29 MED ORDER — AMLODIPINE BESYLATE 5 MG PO TABS
5.0000 mg | ORAL_TABLET | Freq: Every day | ORAL | Status: DC
  Administered 2017-11-29 – 2017-12-02 (×4): 5 mg via ORAL
  Filled 2017-11-29 (×4): qty 1

## 2017-11-29 MED ORDER — PROCHLORPERAZINE EDISYLATE 10 MG/2ML IJ SOLN
10.0000 mg | Freq: Once | INTRAMUSCULAR | Status: AC
Start: 2017-11-29 — End: 2017-11-29
  Administered 2017-11-29: 10 mg via INTRAVENOUS
  Filled 2017-11-29: qty 2

## 2017-11-29 MED ORDER — FAMOTIDINE IN NACL 20-0.9 MG/50ML-% IV SOLN
20.0000 mg | Freq: Two times a day (BID) | INTRAVENOUS | Status: DC
  Administered 2017-11-29 – 2017-12-01 (×4): 20 mg via INTRAVENOUS
  Filled 2017-11-29 (×4): qty 50

## 2017-11-29 MED ORDER — METOCLOPRAMIDE HCL 5 MG/ML IJ SOLN
5.0000 mg | Freq: Once | INTRAMUSCULAR | Status: AC
  Administered 2017-11-29: 5 mg via INTRAVENOUS
  Filled 2017-11-29: qty 2

## 2017-11-29 MED ORDER — ENOXAPARIN SODIUM 60 MG/0.6ML ~~LOC~~ SOLN
60.0000 mg | Freq: Every day | SUBCUTANEOUS | Status: DC
  Administered 2017-11-29 – 2017-12-02 (×4): 60 mg via SUBCUTANEOUS
  Filled 2017-11-29 (×4): qty 0.6

## 2017-11-29 MED ORDER — SODIUM CHLORIDE 0.9 % IV SOLN
INTRAVENOUS | Status: DC
  Administered 2017-11-29 – 2017-12-02 (×4): via INTRAVENOUS

## 2017-11-29 NOTE — Progress Notes (Signed)
[facility] Progress Note  Patient: [name] ZJQ:[id number]   PCP: Rogers Blocker, MD DOB: November 29, 1960   DOA: 11/28/2017   DOS: 11/29/2017   Date of Service: the patient was seen and examined on 11/29/2017  Subjective: Feeling better, has a lot of pain while dressing changes. No nausea no vomiting.  No chest pain no abdominal pain.  Passing gas.  Brief hospital course: Pt. with PMH of HTN; admitted on 11/28/2017, presented with complaint of scrotal swelling, was found to have scrotal abscess S/P surgical I&D. Currently further plan is continue IV antibiotics.  Assessment and Plan: 1.  Sepsis.  Due to scrotal abscess with cellulitis. No evidence of Fournier's gangrene. continue with IV fluids for now. Surgical culture is growing gram-negative rods as well as gram-positive cocci Continue IV vancomycin, IV Zosyn, discontinue IV clindamycin. Urology consulted.  S/P I and D, no mention of gangrene on op note.  2.  Hypertension. Uncontrolled due to holding his home blood pressure medication perioperatively. Resuming Norvasc today as well as PRN hydralazine.  3. Hyponatremia. Sodium level still low.  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 Continue regular diet. BMP every 4 hours while receiving IV hydration.  4.  Cardiomegaly. Check echocardiogram.  Diet: Regular diet DVT Prophylaxis: subcutaneous Heparin  Advance goals of care discussion: full code  Family Communication: no family was present at bedside, at the time of interview.   Disposition:  Discharge to home.  Possibly out of stepdown once sodium trends in the right direction  Consultants: urology Procedures: Incision and debridement of scrotal abscess  Antibiotics: Anti-infectives (From admission, onward)   Start     Dose/Rate Route Frequency Ordered Stop   11/28/17 2200  clindamycin (CLEOCIN) IVPB 600 mg  Status:  Discontinued     600 mg 100 mL/hr over 30 Minutes Intravenous Every 8 hours 11/28/17 1630  11/29/17 0805   11/28/17 2200  piperacillin-tazobactam (ZOSYN) IVPB 3.375 g     3.375 g 12.5 mL/hr over 240 Minutes Intravenous Every 8 hours 11/28/17 1645     11/28/17 2041  polymyxin B 500,000 Units, bacitracin 50,000 Units in sodium chloride 0.9 % 500 mL irrigation  Status:  Discontinued       As needed 11/28/17 2041 11/28/17 2055   11/28/17 1800  vancomycin (VANCOCIN) 1,500 mg in sodium chloride 0.9 % 500 mL IVPB     1,500 mg 250 mL/hr over 120 Minutes Intravenous Every 24 hours 11/28/17 1645     11/28/17 1215  piperacillin-tazobactam (ZOSYN) IVPB 3.375 g     3.375 g 100 mL/hr over 30 Minutes Intravenous  Once 11/28/17 1202 11/28/17 1413   11/28/17 1215  vancomycin (VANCOCIN) IVPB 1000 mg/200 mL premix     1,000 mg 200 mL/hr over 60 Minutes Intravenous  Once 11/28/17 1202 11/28/17 1522   11/28/17 1215  piperacillin-tazobactam (ZOSYN) IVPB 3.375 g  Status:  Discontinued     3.375 g 100 mL/hr over 30 Minutes Intravenous  Once 11/28/17 1212 11/28/17 1213       Objective: Physical Exam: Vitals:   11/29/17 0600 11/29/17 0635 11/29/17 0800 11/29/17 0840  BP: (!) 154/100 (!) 147/103 (!) 165/103 140/83  Pulse: 76 80 86 80  Resp: (!) 22 (!) 25 (!) 21 (!) 21  Temp:   98.7 F (37.1 C)   TempSrc:   Oral   SpO2: 98% 99% 98% 97%  Weight:      Height:        Intake/Output Summary (Last 24 hours)  at 11/29/2017 0855 Last data filed at 11/29/2017 0800 Gross per 24 hour  Intake 5583 ml  Output 1325 ml  Net 4258 ml   Filed Weights   11/28/17 1047 11/28/17 2210  Weight: 121.1 kg (267 lb) 125.7 kg (277 lb 1.9 oz)   General: Alert, Awake and Oriented to Time, Place and Person. Appear in mild distress, affect appropriate Eyes: PERRL, Conjunctiva normal ENT: Oral Mucosa clear moist. Neck: difficult to assess JVD, no Abnormal Mass Or lumps Cardiovascular: S1 and S2 Present, no Murmur, Peripheral Pulses Present Respiratory: normal respiratory effort, Bilateral Air entry equal and  Decreased, no use of accessory muscle, Clear to Auscultation, no Crackles, no wheezes Abdomen: Bowel Sound present, Soft and no tenderness, no hernia Skin: no redness, no Rash, no induration Extremities: no Pedal edema, no calf tenderness Neurologic: Grossly no focal neuro deficit. Bilaterally Equal motor strength  Data Reviewed: CBC: Recent Labs  Lab 11/28/17 1139 11/28/17 1301 11/29/17 0333  WBC 24.1*  --  23.0*  NEUTROABS 20.4*  --  21.3*  HGB 15.6 16.0 13.7  HCT 44.1 47.0 39.9  MCV 91.3  --  91.9  PLT 245  --  528   Basic Metabolic Panel: Recent Labs  Lab 11/28/17 1139 11/28/17 1301 11/29/17 0333  NA 126* 128* 125*  K 4.0 4.3 4.9  CL 94* 90* 98*  CO2 22  --  18*  GLUCOSE 92 94 146*  BUN 17 15 15   CREATININE 1.57* 1.70* 1.27*  CALCIUM 9.0  --  8.0*  MG  --   --  1.5*    Liver Function Tests: Recent Labs  Lab 11/28/17 1139 11/29/17 0333  AST 25 23  ALT 25 21  ALKPHOS 84 71  BILITOT 1.7* 2.3*  PROT 7.2 6.2*  ALBUMIN 3.3* 2.6*   No results for input(s): LIPASE, AMYLASE in the last 168 hours. No results for input(s): AMMONIA in the last 168 hours. Coagulation Profile: Recent Labs  Lab 11/28/17 1656  INR 1.29   Cardiac Enzymes: No results for input(s): CKTOTAL, CKMB, CKMBINDEX, TROPONINI in the last 168 hours. BNP (last 3 results) No results for input(s): PROBNP in the last 8760 hours. CBG: No results for input(s): GLUCAP in the last 168 hours. Studies: Ct Pelvis W Contrast  Result Date: 11/28/2017 CLINICAL DATA:  Scrotal swelling, perineal abscess. EXAM: CT PELVIS WITH CONTRAST TECHNIQUE: Multidetector CT imaging of the pelvis was performed using the standard protocol following the bolus administration of intravenous contrast. CONTRAST:  156mL ISOVUE-300 IOPAMIDOL (ISOVUE-300) INJECTION 61% COMPARISON:  None. FINDINGS: Urinary Tract:  No abnormality visualized. Bowel:  Unremarkable visualized pelvic bowel loops. Vascular/Lymphatic: Mildly enlarged  right inguinal lymph nodes are noted which most likely are inflammatory in etiology. No significant vascular abnormality seen. Reproductive: No mass is noted. There is noted significant wall thickening and edema involving the scrotum. Other: Inflammatory changes are seen in the soft tissues extending from the right side of the scrotum into the right perineal tissues and right inguinal region consistent with cellulitis. No abscess or fluid collection is noted. No soft tissue gas is seen at this time. Musculoskeletal: No suspicious bone lesions identified. IMPRESSION: Severe wall thickening and edema is seen involving the scrotum. Inflammatory changes are seen extending from the scrotum and to the right perineal and inguinal soft tissues. No abscess or soft tissue gas is seen at this time. Enlarged right inguinal adenopathy is noted which most likely is inflammatory in etiology. These findings are consistent with cellulitis, but early  or developing Fournier's gangrene cannot be excluded. Electronically Signed   By: Marijo Conception, M.D.   On: 11/28/2017 15:12   Dg Chest Port 1 View  Result Date: 11/28/2017 CLINICAL DATA:  Weakness, sepsis EXAM: PORTABLE CHEST 1 VIEW COMPARISON:  09/16/2010 FINDINGS: Cardiomegaly with vascular congestion. Interstitial prominence may reflect early interstitial edema. No confluent opacities or effusions. No acute bony abnormality. IMPRESSION: Cardiomegaly with vascular congestion and possible early interstitial edema. Electronically Signed   By: Rolm Baptise M.D.   On: 11/28/2017 17:42    Scheduled Meds: . amLODipine  5 mg Oral Daily  . sodium chloride flush  3 mL Intravenous Q12H   Continuous Infusions: . sodium chloride 100 mL/hr at 11/29/17 0800  . piperacillin-tazobactam (ZOSYN)  IV 3.375 g (11/29/17 0537)  . vancomycin Stopped (11/28/17 1953)   PRN Meds: acetaminophen **OR** acetaminophen, hydrALAZINE, HYDROcodone-acetaminophen, ondansetron **OR** ondansetron (ZOFRAN)  IV  Time spent: 35 minutes  Author: Berle Mull, MD [facility] Pager: [phone] 11/29/2017 8:55 AM  If 7PM-7AM, please contact night-coverage at [email or web address], password [facility]

## 2017-11-29 NOTE — Progress Notes (Signed)
Patient vomiting chucks of solid foods. MD Posey Pronto paged and notified. Wife concerned of transfer. Reassured wife that patient is stable and vomiting is being controlled. MD Posey Pronto also talked to wife that patient is able to transfer still.

## 2017-11-29 NOTE — Evaluation (Signed)
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 S/P I&D of perineal/scrotal abscess 11/28/17. Hx of back surgery.     Clinical Impression  On eval, pt was Min guard assist for mobility. He walked ~150 feet with a RW. Pt c/o discomfort in scrotal area with activity. Dyspnea 2/4 however O2 sats were >90% on RA. Will continue to follow and progress activity as tolerated. Also recommend daily ambulation in hallway with nursing supervision.     Follow Up Recommendations No PT follow up    Equipment Recommendations  (continuing to assess-hopefully will not need a walker at d/c)    Recommendations for Other Services       Precautions / Restrictions Precautions Precautions: Fall Precaution Comments: scrotal edema Restrictions Weight Bearing Restrictions: No      Mobility  Bed Mobility Overal bed mobility: Needs Assistance Bed Mobility: Supine to Sit     Supine to sit: HOB elevated;Supervision     General bed mobility comments: for safety, lines  Transfers Overall transfer level: Needs assistance Equipment used: None Transfers: Sit to/from Stand Sit to Stand: Supervision         General transfer comment: for safety, lines. Increased time.   Ambulation/Gait Ambulation/Gait assistance: Min guard Ambulation Distance (Feet): 150 Feet Assistive device: Rolling walker (2 wheeled) Gait Pattern/deviations: Step-through pattern;Decreased stride length     General Gait Details: close guard for safety. Pt preferred to use the walker for safety. He tolerated activity fairly well.   Stairs            Wheelchair Mobility    Modified Rankin (Stroke Patients Only)       Balance                                             Pertinent Vitals/Pain Pain Assessment: Faces Faces Pain Scale: Hurts  even more Pain Location: perineal/scrotal area Pain Descriptors / Indicators: Discomfort;Sore;Grimacing Pain Intervention(s): Monitored during session;Repositioned    Home Living Family/patient expects to be discharged to:: Private residence Living Arrangements: Spouse/significant other Available Help at Discharge: Family Type of Home: House Home Access: Stairs to enter   Technical brewer of Steps: 2   Home Equipment: None      Prior Function Level of Independence: Independent               Hand Dominance        Extremity/Trunk Assessment   Upper Extremity Assessment Upper Extremity Assessment: Overall WFL for tasks assessed    Lower Extremity Assessment Lower Extremity Assessment: Generalized weakness    Cervical / Trunk Assessment Cervical / Trunk Assessment: Normal  Communication   Communication: No difficulties  Cognition Arousal/Alertness: Awake/alert Behavior During Therapy: WFL for tasks assessed/performed Overall Cognitive Status: Within Functional Limits for tasks assessed                                        General Comments      Exercises     Assessment/Plan    PT Assessment Patient needs continued PT services  PT Problem List Decreased mobility;Decreased activity tolerance;Pain       PT Treatment Interventions Gait training;Therapeutic  activities;Functional mobility training;Balance training;Patient/family education;Therapeutic exercise    PT Goals (Current goals can be found in the Care Plan section)  Acute Rehab PT Goals Patient Stated Goal: less swelling, discomfort PT Goal Formulation: With patient Time For Goal Achievement: 12/13/17 Potential to Achieve Goals: Good    Frequency Min 3X/week   Barriers to discharge        Co-evaluation               AM-PAC PT "6 Clicks" Daily Activity  Outcome Measure Difficulty turning over in bed (including adjusting bedclothes, sheets and blankets)?: A  Lot Difficulty moving from lying on back to sitting on the side of the bed? : A Lot Difficulty sitting down on and standing up from a chair with arms (e.g., wheelchair, bedside commode, etc,.)?: A Little Help needed moving to and from a bed to chair (including a wheelchair)?: A Little Help needed walking in hospital room?: A Little Help needed climbing 3-5 steps with a railing? : A Little 6 Click Score: 16    End of Session   Activity Tolerance: Patient tolerated treatment well Patient left: (in bathroom with NT in room to assist him one finished)   PT Visit Diagnosis: Difficulty in walking, not elsewhere classified (R26.2);Pain Pain - part of body: (scrotal area)    Time: 6433-2951 PT Time Calculation (min) (ACUTE ONLY): 16 min   Charges:   PT Evaluation $PT Eval Moderate Complexity: 1 Mod     PT G Codes:         Weston Anna, MPT Pager: [phone]

## 2017-11-29 NOTE — Progress Notes (Signed)
PHARMACY CONSULT: Lovenox for VTE prophylaxis   Wt: 125.7 kg BMI:  37.5 Scr:  1.27 CrCl >30 ml/hr  CBC:  Hgb and Plt WNL  A/P:  Due to obesity, begin weight-adjusted lovenox 0.5mg /kg, 60 mg SQ q24h  Pharmacy will sign off  Monitor CBC and renal function, adjust as needed  Gretta Arab PharmD, BCPS Pager [phone] 11/29/2017 9:50 AM

## 2017-11-29 NOTE — Progress Notes (Signed)
Patient ID: ZACHREY DEUTSCHER, male   DOB: Jun 30, 1961, 57 y.o.   MRN: [id number]  1 Day Post-Op Subjective: Pt feeling better since I&D last night.  Pain improved.  Fever curve improved.  Objective: Vital signs in last 24 hours: Temp:  [98.3 F (36.8 C)-100.8 F (38.2 C)] 98.3 F (36.8 C) (04/25 0410) Pulse Rate:  [76-116] 80 (04/25 0635) Resp:  [11-29] 25 (04/25 0635) BP: (111-175)/(71-119) 147/103 (04/25 0635) SpO2:  [92 %-100 %] 99 % (04/25 0635) Weight:  [121.1 kg (267 lb)-125.7 kg (277 lb 1.9 oz)] 125.7 kg (277 lb 1.9 oz) (04/24 2210)  Intake/Output from previous day: 04/24 0701 - 04/25 0700 In: 4933 [I.V.:500; IV Piggyback:4433] Out: 1325 [Urine:1325] Intake/Output this shift: Total I/O In: 50 [IV Piggyback:50] Out: -   Physical Exam:  General: Alert and oriented GU: Wound packed with decreasing erythema.  No crepitus.  Still severe edema of scrotum as expected.  Lab Results: Recent Labs    11/28/17 1139 11/28/17 1301 11/29/17 0333  HGB 15.6 16.0 13.7  HCT 44.1 47.0 39.9   CBC Latest Ref Rng & Units 11/29/2017 11/28/2017 11/28/2017  WBC 4.0 - 10.5 K/uL 23.0(H) - 24.1(H)  Hemoglobin 13.0 - 17.0 g/dL 13.7 16.0 15.6  Hematocrit 39.0 - 52.0 % 39.9 47.0 44.1  Platelets 150 - 400 K/uL 235 - 245     BMET Recent Labs    11/28/17 1139 11/28/17 1301 11/29/17 0333  NA 126* 128* 125*  K 4.0 4.3 4.9  CL 94* 90* 98*  CO2 22  --  18*  GLUCOSE 92 94 146*  BUN 17 15 15   CREATININE 1.57* 1.70* 1.27*  CALCIUM 9.0  --  8.0*     Studies/Results: GS: GPC and GPRs  Assessment/Plan: Perineal abscess: Improving as expected after surgical drainage.  Continue antibiotic therapy.  May change coverage to gram positive coverage with gram stain from OR last night.  Continue wound care with TID dressing changes.   LOS: 1 day   Yamilka Lopiccolo,LES 11/29/2017, 7:47 AM

## 2017-11-29 NOTE — Progress Notes (Signed)
Dr Posey Pronto called concerning patient vomiting again after getting Phenergan after vomiting previously. Order for Reglan given. Will continue to monitor.

## 2017-11-30 ENCOUNTER — Inpatient Hospital Stay (HOSPITAL_COMMUNITY): Payer: Medicare Other

## 2017-11-30 DIAGNOSIS — I517 Cardiomegaly: Secondary | ICD-10-CM

## 2017-11-30 LAB — BASIC METABOLIC PANEL
Anion gap: 9 (ref 5–15)
BUN: 20 mg/dL (ref 6–20)
CALCIUM: 8.3 mg/dL — AB (ref 8.9–10.3)
CHLORIDE: 101 mmol/L (ref 101–111)
CO2: 22 mmol/L (ref 22–32)
CREATININE: 1.29 mg/dL — AB (ref 0.61–1.24)
GFR calc Af Amer: >60 mL/min (ref >60)
GFR calc non Af Amer: >60 mL/min (ref >60)
Glucose, Bld: 122 mg/dL — ABNORMAL HIGH (ref 65–99)
Potassium: 4.1 mmol/L (ref 3.5–5.1)
Sodium: 132 mmol/L — ABNORMAL LOW (ref 135–145)

## 2017-11-30 LAB — AEROBIC CULTURE  (SUPERFICIAL SPECIMEN)

## 2017-11-30 LAB — ECHOCARDIOGRAM COMPLETE
Height: 72 in
Weight: 4433.89 oz

## 2017-11-30 LAB — AEROBIC CULTURE W GRAM STAIN (SUPERFICIAL SPECIMEN)

## 2017-11-30 LAB — CBC
HCT: 38.1 % — ABNORMAL LOW (ref 39.0–52.0)
Hemoglobin: 13.1 g/dL (ref 13.0–17.0)
MCH: 31.6 pg (ref 26.0–34.0)
MCHC: 34.4 g/dL (ref 30.0–36.0)
MCV: 91.8 fL (ref 78.0–100.0)
PLATELETS: 236 10*3/uL (ref 150–400)
RBC: 4.15 MIL/uL — ABNORMAL LOW (ref 4.22–5.81)
RDW: 12.7 % (ref 11.5–15.5)
WBC: 19.1 10*3/uL — AB (ref 4.0–10.5)

## 2017-11-30 MED ORDER — CEFAZOLIN SODIUM-DEXTROSE 2-4 GM/100ML-% IV SOLN
2.0000 g | Freq: Three times a day (TID) | INTRAVENOUS | Status: DC
  Administered 2017-11-30 – 2017-12-01 (×4): 2 g via INTRAVENOUS
  Filled 2017-11-30 (×7): qty 100

## 2017-11-30 NOTE — Progress Notes (Signed)
  Echocardiogram 2D Echocardiogram has been performed.  Douglass Dunshee T Dalexa Gentz 11/30/2017, 9:43 AM

## 2017-11-30 NOTE — Progress Notes (Signed)
[facility] Progress Note  Patient: [name] [id number]:[id number]   PCP: Rogers Blocker, MD DOB: Jul 22, 1961   DOA: 11/28/2017   DOS: 11/30/2017   Date of Service: the patient was seen and examined on 11/30/2017  Subjective: Multiple episodes of nausea and vomiting yesterday.  Currently no more nausea or vomiting this morning.  No abdominal pain.  Passing gas.  Brief hospital course: Pt. with PMH of HTN; admitted on 11/28/2017, presented with complaint of scrotal swelling, was found to have scrotal abscess S/P surgical I&D. Currently further plan is continue IV antibiotics.  Assessment and Plan: 1.  Sepsis.  Due to scrotal abscess with cellulitis. No evidence of Fournier's gangrene. continue with IV fluids for now. Surgical culture is growing gram-positive rods as well as gram-positive cocci, currently organism ID is Staphylococcus aureus.  Based on MRSA PCR negative as well as sensitivities from the superficial culture appears to have MSSA infection. Initially treated with IV vancomycin, IV Zosyn, after discussing with ID will switch him to Ancef IV.  ID does not feel patient requires long-term IV antibiotics. Urology consulted.  S/P I and D, no mention of gangrene on op note.  2.  Hypertension. Uncontrolled due to holding his home blood pressure medication perioperatively. Resuming Norvasc today as well as PRN hydralazine.  3. Hyponatremia. Sodium level still low.  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 Now better.  Monitor.  4.  Cardiomegaly. Echocardiogram shows chronic diastolic dysfunction.  No other abnormality.  Diet: Clear liquid diet DVT Prophylaxis: subcutaneous Heparin  Advance goals of care discussion: full code  Family Communication: no family was present at bedside, at the time of interview.   Disposition:  Discharge to home.   Consultants: urology Procedures: Incision and debridement of scrotal abscess  Antibiotics: Anti-infectives (From admission,  onward)   Start     Dose/Rate Route Frequency Ordered Stop   11/30/17 1400  ceFAZolin (ANCEF) IVPB 2g/100 mL premix     2 g 200 mL/hr over 30 Minutes Intravenous Every 8 hours 11/30/17 1328     11/28/17 2200  clindamycin (CLEOCIN) IVPB 600 mg  Status:  Discontinued     600 mg 100 mL/hr over 30 Minutes Intravenous Every 8 hours 11/28/17 1630 11/29/17 0805   11/28/17 2200  piperacillin-tazobactam (ZOSYN) IVPB 3.375 g  Status:  Discontinued     3.375 g 12.5 mL/hr over 240 Minutes Intravenous Every 8 hours 11/28/17 1645 11/30/17 1315   11/28/17 2041  polymyxin B 500,000 Units, bacitracin 50,000 Units in sodium chloride 0.9 % 500 mL irrigation  Status:  Discontinued       As needed 11/28/17 2041 11/28/17 2055   11/28/17 1800  vancomycin (VANCOCIN) 1,500 mg in sodium chloride 0.9 % 500 mL IVPB  Status:  Discontinued     1,500 mg 250 mL/hr over 120 Minutes Intravenous Every 24 hours 11/28/17 1645 11/30/17 1315   11/28/17 1215  piperacillin-tazobactam (ZOSYN) IVPB 3.375 g     3.375 g 100 mL/hr over 30 Minutes Intravenous  Once 11/28/17 1202 11/28/17 1413   11/28/17 1215  vancomycin (VANCOCIN) IVPB 1000 mg/200 mL premix     1,000 mg 200 mL/hr over 60 Minutes Intravenous  Once 11/28/17 1202 11/28/17 1522   11/28/17 1215  piperacillin-tazobactam (ZOSYN) IVPB 3.375 g  Status:  Discontinued     3.375 g 100 mL/hr over 30 Minutes Intravenous  Once 11/28/17 1212 11/28/17 1213       Objective: Physical Exam: Vitals:   11/29/17 1728 11/29/17  2252 11/30/17 0610 11/30/17 1447  BP: (!) 136/93 (!) 139/91 (!) 141/97 (!) 176/106  Pulse: 80 87 72 97  Resp: 20 16 17 16   Temp: 99 F (37.2 C) 98.4 F (36.9 C) 98.2 F (36.8 C) 98.1 F (36.7 C)  TempSrc: Oral Oral Oral Axillary  SpO2: 100% 95% 97% 98%  Weight:      Height:        Intake/Output Summary (Last 24 hours) at 11/30/2017 1752 Last data filed at 11/30/2017 0353 Gross per 24 hour  Intake 2188.33 ml  Output 400 ml  Net 1788.33 ml   Filed  Weights   11/28/17 1047 11/28/17 2210  Weight: 121.1 kg (267 lb) 125.7 kg (277 lb 1.9 oz)   General: Alert, Awake and Oriented to Time, Place and Person. Appear in mild distress, affect appropriate Eyes: PERRL, Conjunctiva normal ENT: Oral Mucosa clear moist. Neck: difficult to assess JVD, no Abnormal Mass Or lumps Cardiovascular: S1 and S2 Present, no Murmur, Peripheral Pulses Present Respiratory: normal respiratory effort, Bilateral Air entry equal and Decreased, no use of accessory muscle, Clear to Auscultation, no Crackles, no wheezes Abdomen: Bowel Sound present, Soft and no tenderness, no hernia Skin: no redness, no Rash, no induration Extremities: no Pedal edema, no calf tenderness Neurologic: Grossly no focal neuro deficit. Bilaterally Equal motor strength  Data Reviewed: CBC: Recent Labs  Lab 11/28/17 1139 11/28/17 1301 11/29/17 0333 11/30/17 0419  WBC 24.1*  --  23.0* 19.1*  NEUTROABS 20.4*  --  21.3*  --   HGB 15.6 16.0 13.7 13.1  HCT 44.1 47.0 39.9 38.1*  MCV 91.3  --  91.9 91.8  PLT 245  --  235 607   Basic Metabolic Panel: Recent Labs  Lab 11/29/17 0333 11/29/17 1048 11/29/17 1458 11/29/17 1940 11/30/17 0419  NA 125* 129* 129* 132* 132*  K 4.9 4.4 4.3 4.5 4.1  CL 98* 100* 98* 99* 101  CO2 18* 21* 21* 22 22  GLUCOSE 146* 153* 162* 132* 122*  BUN 15 17 20 19 20   CREATININE 1.27* 1.30* 1.35* 1.33* 1.29*  CALCIUM 8.0* 8.4* 8.5* 8.5* 8.3*  MG 1.5*  --   --   --   --     Liver Function Tests: Recent Labs  Lab 11/28/17 1139 11/29/17 0333  AST 25 23  ALT 25 21  ALKPHOS 84 71  BILITOT 1.7* 2.3*  PROT 7.2 6.2*  ALBUMIN 3.3* 2.6*   No results for input(s): LIPASE, AMYLASE in the last 168 hours. No results for input(s): AMMONIA in the last 168 hours. Coagulation Profile: Recent Labs  Lab 11/28/17 1656  INR 1.29   Cardiac Enzymes: No results for input(s): CKTOTAL, CKMB, CKMBINDEX, TROPONINI in the last 168 hours. BNP (last 3 results) No results  for input(s): PROBNP in the last 8760 hours. CBG: No results for input(s): GLUCAP in the last 168 hours. Studies: Dg Abd Portable 1v  Result Date: 11/29/2017 CLINICAL DATA:  Pt has been vomiting nonstop since about noon today. Severe nausea and abd pain with distention. Recent surgery yesterday. EXAM: PORTABLE ABDOMEN - 1 VIEW COMPARISON:  CT of the abdomen and pelvis 11/28/2017 FINDINGS: The stomach is distended. There is question of prominent gastric folds. Bowel gas pattern is nonobstructive. No evidence for organomegaly. Pelvic phleboliths are present. Previous lumbar fusion. No evidence for free air. Stable elevation of the RIGHT hemidiaphragm. IMPRESSION: Distension of the stomach. Possible rugal fold thickening raising the question of gastritis. Electronically Signed   By: Benjamine Mola  Owens Shark M.D.   On: 11/29/2017 18:58    Scheduled Meds: . amLODipine  5 mg Oral Daily  . enoxaparin (LOVENOX) injection  60 mg Subcutaneous Daily  . sodium chloride flush  3 mL Intravenous Q12H   Continuous Infusions: . sodium chloride 100 mL/hr at 11/29/17 1854  .  ceFAZolin (ANCEF) IV Stopped (11/30/17 1630)  . famotidine (PEPCID) IV Stopped (11/30/17 1126)   PRN Meds: acetaminophen **OR** acetaminophen, hydrALAZINE, HYDROcodone-acetaminophen, ondansetron **OR** ondansetron (ZOFRAN) IV, promethazine  Time spent: 35 minutes  Author: Berle Mull, MD [facility] Pager: [phone] 11/30/2017 5:52 PM  If 7PM-7AM, please contact night-coverage at [email or web address], password [facility]

## 2017-11-30 NOTE — Progress Notes (Signed)
Pharmacy Antibiotic Note  [name] is a 57 y.o. male admitted on 11/28/2017 with sepsis due to scrotal abscess with cellulitis.  Pharmacy has been consulted for ancef dosing.  Plan: Ancef 2gm IV q8h Follow renal function and clinical course  Height: 6' (182.9 cm) Weight: 277 lb 1.9 oz (125.7 kg) IBW/kg (Calculated) : 77.6  Temp (24hrs), Avg:98.5 F (36.9 C), Min:98.2 F (36.8 C), Max:99 F (37.2 C)  Recent Labs  Lab 11/28/17 1139 11/28/17 1145 11/28/17 1301 11/29/17 0333 11/29/17 1048 11/29/17 1458 11/29/17 1940 11/30/17 0419  WBC 24.1*  --   --  23.0*  --   --   --  19.1*  CREATININE 1.57*  --  1.70* 1.27* 1.30* 1.35* 1.33* 1.29*  LATICACIDVEN  --  0.92 1.11 1.1  --   --   --   --     Estimated Creatinine Clearance: 87.5 mL/min (A) (by C-G formula based on SCr of 1.29 mg/dL (H)).    No Known Allergies  Antimicrobials this admission: 4/24 vancomycin >> 4/26 4/24 zosyn >> 4/26 4/24 clindamycin >>4/25 4/26 ancef >>  Dose adjustments this admission:   Microbiology results: 4/24 BCx: ngtd 4/24 wound Cx: staph aureus 4/24 MRSA PCR: negative  Thank you for allowing pharmacy to be a part of this patient's care.  Dolly Rias RPh 11/30/2017, 2:17 PM Pager [phone]

## 2017-11-30 NOTE — Progress Notes (Signed)
Patient ID: [name], male   DOB: August 11, 1960, 57 y.o.   MRN: [id number]  2 Days Post-Op Subjective: Pt with nausea and vomiting multiple times yesterday.  Significant improvement of scrotal/perineal pain. No fever.  Objective: Vital signs in last 24 hours: Temp:  [98.2 F (36.8 C)-99 F (37.2 C)] 98.2 F (36.8 C) (04/26 0610) Pulse Rate:  [72-87] 72 (04/26 0610) Resp:  [16-21] 17 (04/26 0610) BP: (136-155)/(83-102) 141/97 (04/26 0610) SpO2:  [95 %-100 %] 97 % (04/26 0610)  Intake/Output from previous day: 04/25 0701 - 04/26 0700 In: 3688.3 [P.O.:800; I.V.:2188.3; IV Piggyback:700] Out: 900 [Emesis/NG output:900] Intake/Output this shift: No intake/output data recorded.  Physical Exam:  General: Alert and oriented GU: Wound beginning to granulate.  Erythema decreased with less induration of inguinal region on right and decreased scrotal edema and tenderness.  No crepitus.  Lab Results: Recent Labs    11/28/17 1301 11/29/17 0333 11/30/17 0419  HGB 16.0 13.7 13.1  HCT 47.0 39.9 38.1*   CBC Latest Ref Rng & Units 11/30/2017 11/29/2017 11/28/2017  WBC 4.0 - 10.5 K/uL 19.1(H) 23.0(H) -  Hemoglobin 13.0 - 17.0 g/dL 13.1 13.7 16.0  Hematocrit 39.0 - 52.0 % 38.1(L) 39.9 47.0  Platelets 150 - 400 K/uL 236 235 -     BMET Recent Labs    11/29/17 1940 11/30/17 0419  NA 132* 132*  K 4.5 4.1  CL 99* 101  CO2 22 22  GLUCOSE 132* 122*  BUN 19 20  CREATININE 1.33* 1.29*  CALCIUM 8.5* 8.3*     Studies/Results: Ct Pelvis W Contrast  Result Date: 11/28/2017 CLINICAL DATA:  Scrotal swelling, perineal abscess. EXAM: CT PELVIS WITH CONTRAST TECHNIQUE: Multidetector CT imaging of the pelvis was performed using the standard protocol following the bolus administration of intravenous contrast. CONTRAST:  113mL ISOVUE-300 IOPAMIDOL (ISOVUE-300) INJECTION 61% COMPARISON:  None. FINDINGS: Urinary Tract:  No abnormality visualized. Bowel:  Unremarkable visualized pelvic bowel loops.  Vascular/Lymphatic: Mildly enlarged right inguinal lymph nodes are noted which most likely are inflammatory in etiology. No significant vascular abnormality seen. Reproductive: No mass is noted. There is noted significant wall thickening and edema involving the scrotum. Other: Inflammatory changes are seen in the soft tissues extending from the right side of the scrotum into the right perineal tissues and right inguinal region consistent with cellulitis. No abscess or fluid collection is noted. No soft tissue gas is seen at this time. Musculoskeletal: No suspicious bone lesions identified. IMPRESSION: Severe wall thickening and edema is seen involving the scrotum. Inflammatory changes are seen extending from the scrotum and to the right perineal and inguinal soft tissues. No abscess or soft tissue gas is seen at this time. Enlarged right inguinal adenopathy is noted which most likely is inflammatory in etiology. These findings are consistent with cellulitis, but early or developing Fournier's gangrene cannot be excluded. Electronically Signed   By: Marijo Conception, M.D.   On: 11/28/2017 15:12   Dg Chest Port 1 View  Result Date: 11/28/2017 CLINICAL DATA:  Weakness, sepsis EXAM: PORTABLE CHEST 1 VIEW COMPARISON:  09/16/2010 FINDINGS: Cardiomegaly with vascular congestion. Interstitial prominence may reflect early interstitial edema. No confluent opacities or effusions. No acute bony abnormality. IMPRESSION: Cardiomegaly with vascular congestion and possible early interstitial edema. Electronically Signed   By: Rolm Baptise M.D.   On: 11/28/2017 17:42   Dg Abd Portable 1v  Result Date: 11/29/2017 CLINICAL DATA:  Pt has been vomiting nonstop since about noon today. Severe nausea and abd  pain with distention. Recent surgery yesterday. EXAM: PORTABLE ABDOMEN - 1 VIEW COMPARISON:  CT of the abdomen and pelvis 11/28/2017 FINDINGS: The stomach is distended. There is question of prominent gastric folds. Bowel gas  pattern is nonobstructive. No evidence for organomegaly. Pelvic phleboliths are present. Previous lumbar fusion. No evidence for free air. Stable elevation of the RIGHT hemidiaphragm. IMPRESSION: Distension of the stomach. Possible rugal fold thickening raising the question of gastritis. Electronically Signed   By: Nolon Nations M.D.   On: 11/29/2017 18:58    Assessment/Plan: 1) Perineal abscess: He continues to clinically improve s/p drainage of abscess.  Continue local wound care with TID wet to dry saline dressings.  Would be good for nursing staff to begin teaching of wound care to family and to begin arranging [facility] for assistance with wound care as outpatient.  Continue vancomycin and Zosyn with GS from OR demonstrating GPC and GPR and culture from ER also with few GNRs.  Awaiting final culture results.  Should be able to be discharged from GU standpoint once cultures return if otherwise medically doing well.  Unclear as to etiology of nausea and vomiting.  Will continue to follow.   LOS: 2 days   Labrandon Knoch,LES 11/30/2017, 8:02 AM

## 2017-12-01 LAB — CBC
HEMATOCRIT: 38.9 % — AB (ref 39.0–52.0)
HEMOGLOBIN: 13.3 g/dL (ref 13.0–17.0)
MCH: 31.1 pg (ref 26.0–34.0)
MCHC: 34.2 g/dL (ref 30.0–36.0)
MCV: 91.1 fL (ref 78.0–100.0)
Platelets: 282 10*3/uL (ref 150–400)
RBC: 4.27 MIL/uL (ref 4.22–5.81)
RDW: 12.6 % (ref 11.5–15.5)
WBC: 11.8 10*3/uL — AB (ref 4.0–10.5)

## 2017-12-01 LAB — BASIC METABOLIC PANEL
Anion gap: 10 (ref 5–15)
BUN: 15 mg/dL (ref 6–20)
CALCIUM: 8.3 mg/dL — AB (ref 8.9–10.3)
CO2: 21 mmol/L — AB (ref 22–32)
CREATININE: 1.23 mg/dL (ref 0.61–1.24)
Chloride: 98 mmol/L — ABNORMAL LOW (ref 101–111)
GFR calc non Af Amer: >60 mL/min (ref >60)
GLUCOSE: 81 mg/dL (ref 65–99)
Potassium: 3.8 mmol/L (ref 3.5–5.1)
Sodium: 129 mmol/L — ABNORMAL LOW (ref 135–145)

## 2017-12-01 MED ORDER — FAMOTIDINE 20 MG PO TABS
20.0000 mg | ORAL_TABLET | Freq: Two times a day (BID) | ORAL | Status: DC
  Administered 2017-12-01 – 2017-12-02 (×2): 20 mg via ORAL
  Filled 2017-12-01 (×2): qty 1

## 2017-12-01 MED ORDER — DOXYCYCLINE HYCLATE 100 MG PO TABS
100.0000 mg | ORAL_TABLET | Freq: Two times a day (BID) | ORAL | Status: DC
  Administered 2017-12-01 – 2017-12-02 (×2): 100 mg via ORAL
  Filled 2017-12-01 (×2): qty 1

## 2017-12-01 NOTE — Progress Notes (Signed)
Patient ID: [name], male   DOB: 1961/01/08, 57 y.o.   MRN: [id number]  3 Days Post-Op Subjective: Pt with minimal pain.  Nausea and vomiting resolved.  No fever overnight.  Objective: Vital signs in last 24 hours: Temp:  [98 F (36.7 C)-98.5 F (36.9 C)] 98.5 F (36.9 C) (04/27 0604) Pulse Rate:  [81-97] 81 (04/27 0604) Resp:  [15-16] 16 (04/27 0604) BP: (152-176)/(97-108) 160/108 (04/27 0604) SpO2:  [97 %-98 %] 97 % (04/27 0604)  Intake/Output from previous day: 04/26 0701 - 04/27 0700 In: 2626.7 [I.V.:2326.7; IV Piggyback:300] Out: -  Intake/Output this shift: No intake/output data recorded.  Physical Exam:  General: Alert and oriented GU: Wound granulating well and already becoming fairly superficial.  Erythema and edema significantly improved over last 24 hours.  Lab Results: Recent Labs    11/29/17 0333 11/30/17 0419 12/01/17 0355  HGB 13.7 13.1 13.3  HCT 39.9 38.1* 38.9*   CBC Latest Ref Rng & Units 12/01/2017 11/30/2017 11/29/2017  WBC 4.0 - 10.5 K/uL 11.8(H) 19.1(H) 23.0(H)  Hemoglobin 13.0 - 17.0 g/dL 13.3 13.1 13.7  Hematocrit 39.0 - 52.0 % 38.9(L) 38.1(L) 39.9  Platelets 150 - 400 K/uL 282 236 235     BMET Recent Labs    11/30/17 0419 12/01/17 0355  NA 132* 129*  K 4.1 3.8  CL 101 98*  CO2 22 21*  GLUCOSE 122* 81  BUN 20 15  CREATININE 1.29* 1.23  CALCIUM 8.3* 8.3*     Studies/Results: Culture from ER: Staph aureus sensitive to doxycycline Cultures from OR preliminarily c/w with same result  Assessment/Plan: Perineal abscess: Improved clinically.  Would be ok to switch to po antibiotics such as doxycycline for 2 weeks.  He will need wet to dry saline dressing changes BID upon discharge which likely could be performed by his wife as wound is becoming more superficial.  His appt at our office is being scheduled for about 10 days to check wound.  Victor for discharge from GU standpoint.   LOS: 3 days   Lyda Colcord,LES 12/01/2017, 9:23 AM

## 2017-12-01 NOTE — Progress Notes (Signed)
[facility] Progress Note  Patient: [name] VHQ:[id number]   PCP: Rogers Blocker, MD DOB: 1961/07/14   DOA: 11/28/2017   DOS: 12/01/2017   Date of Service: the patient was seen and examined on 12/01/2017  Subjective: No more nausea no vomiting.  No abdominal pain.  No fever no chills.  Brief hospital course: Pt. with PMH of HTN; admitted on 11/28/2017, presented with complaint of scrotal swelling, was found to have scrotal abscess S/P surgical I&D. Currently further plan is continue antibiotics.  Assessment and Plan: 1.  Sepsis.  Due to scrotal abscess with cellulitis. No evidence of Fournier's gangrene. continue with IV fluids for now. Surgical culture is growing gram-positive rods as well as gram-positive cocci, currently organism ID is Staphylococcus aureus.  Based on MRSA PCR negative as well as sensitivities from the superficial culture appears to have MSSA infection. Initially treated with IV vancomycin, IV Zosyn, after discussing with ID will switch him to Ancef IV.  Now that the patient's deep cultures also growing staph aureus change to oral doxycycline and monitor. ID does not feel patient requires long-term IV antibiotics. Urology consulted.  S/P I and D, no mention of gangrene  2.  Hypertension. Uncontrolled due to holding his home blood pressure medication perioperatively. Resuming Norvasc today as well as PRN hydralazine.  3. Hyponatremia. Sodium level still low.  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 Now better.  Monitor.  4.  Cardiomegaly. Echocardiogram shows chronic diastolic dysfunction.  No other abnormality.  5.  Gastritis. X-ray abdomen shows postoperative gastritis.  Patient had severe nausea and vomiting postoperatively. Continue Pepcid. PRN Zofran. Advance diet right now.  Diet: Full liquid to soft diet DVT Prophylaxis: subcutaneous Heparin  Advance goals of care discussion: full code  Family Communication: no family was present at  bedside, at the time of interview.   Disposition:  Discharge to home.   Consultants: urology Procedures: Incision and debridement of scrotal abscess  Antibiotics: Anti-infectives (From admission, onward)   Start     Dose/Rate Route Frequency Ordered Stop   12/01/17 1530  doxycycline (VIBRA-TABS) tablet 100 mg     100 mg Oral Every 12 hours 12/01/17 1525     11/30/17 1400  ceFAZolin (ANCEF) IVPB 2g/100 mL premix  Status:  Discontinued     2 g 200 mL/hr over 30 Minutes Intravenous Every 8 hours 11/30/17 1328 12/01/17 1525   11/28/17 2200  clindamycin (CLEOCIN) IVPB 600 mg  Status:  Discontinued     600 mg 100 mL/hr over 30 Minutes Intravenous Every 8 hours 11/28/17 1630 11/29/17 0805   11/28/17 2200  piperacillin-tazobactam (ZOSYN) IVPB 3.375 g  Status:  Discontinued     3.375 g 12.5 mL/hr over 240 Minutes Intravenous Every 8 hours 11/28/17 1645 11/30/17 1315   11/28/17 2041  polymyxin B 500,000 Units, bacitracin 50,000 Units in sodium chloride 0.9 % 500 mL irrigation  Status:  Discontinued       As needed 11/28/17 2041 11/28/17 2055   11/28/17 1800  vancomycin (VANCOCIN) 1,500 mg in sodium chloride 0.9 % 500 mL IVPB  Status:  Discontinued     1,500 mg 250 mL/hr over 120 Minutes Intravenous Every 24 hours 11/28/17 1645 11/30/17 1315   11/28/17 1215  piperacillin-tazobactam (ZOSYN) IVPB 3.375 g     3.375 g 100 mL/hr over 30 Minutes Intravenous  Once 11/28/17 1202 11/28/17 1413   11/28/17 1215  vancomycin (VANCOCIN) IVPB 1000 mg/200 mL premix     1,000 mg 200  mL/hr over 60 Minutes Intravenous  Once 11/28/17 1202 11/28/17 1522   11/28/17 1215  piperacillin-tazobactam (ZOSYN) IVPB 3.375 g  Status:  Discontinued     3.375 g 100 mL/hr over 30 Minutes Intravenous  Once 11/28/17 1212 11/28/17 1213       Objective: Physical Exam: Vitals:   11/30/17 1447 11/30/17 2220 12/01/17 0604 12/01/17 1316  BP: (!) 176/106 (!) 152/97 (!) 160/108 (!) 143/95  Pulse: 97 86 81 79  Resp: 16 15 16      Temp: 98.1 F (36.7 C) 98 F (36.7 C) 98.5 F (36.9 C) 98.2 F (36.8 C)  TempSrc: Axillary Oral Oral Oral  SpO2: 98% 97% 97% 99%  Weight:      Height:        Intake/Output Summary (Last 24 hours) at 12/01/2017 1526 Last data filed at 12/01/2017 0309 Gross per 24 hour  Intake 2626.67 ml  Output -  Net 2626.67 ml   Filed Weights   11/28/17 1047 11/28/17 2210  Weight: 121.1 kg (267 lb) 125.7 kg (277 lb 1.9 oz)   General: Alert, Awake and Oriented to Time, Place and Person. Appear in mild distress, affect appropriate Eyes: PERRL, Conjunctiva normal ENT: Oral Mucosa clear moist. Neck: difficult to assess JVD, no Abnormal Mass Or lumps Cardiovascular: S1 and S2 Present, no Murmur, Peripheral Pulses Present Respiratory: normal respiratory effort, Bilateral Air entry equal and Decreased, no use of accessory muscle, Clear to Auscultation, no Crackles, no wheezes Abdomen: Bowel Sound present, Soft and no tenderness, no hernia Skin: no redness, no Rash, no induration Extremities: no Pedal edema, no calf tenderness Neurologic: Grossly no focal neuro deficit. Bilaterally Equal motor strength  Data Reviewed: CBC: Recent Labs  Lab 11/28/17 1139 11/28/17 1301 11/29/17 0333 11/30/17 0419 12/01/17 0355  WBC 24.1*  --  23.0* 19.1* 11.8*  NEUTROABS 20.4*  --  21.3*  --   --   HGB 15.6 16.0 13.7 13.1 13.3  HCT 44.1 47.0 39.9 38.1* 38.9*  MCV 91.3  --  91.9 91.8 91.1  PLT 245  --  235 236 703   Basic Metabolic Panel: Recent Labs  Lab 11/29/17 0333 11/29/17 1048 11/29/17 1458 11/29/17 1940 11/30/17 0419 12/01/17 0355  NA 125* 129* 129* 132* 132* 129*  K 4.9 4.4 4.3 4.5 4.1 3.8  CL 98* 100* 98* 99* 101 98*  CO2 18* 21* 21* 22 22 21*  GLUCOSE 146* 153* 162* 132* 122* 81  BUN 15 17 20 19 20 15   CREATININE 1.27* 1.30* 1.35* 1.33* 1.29* 1.23  CALCIUM 8.0* 8.4* 8.5* 8.5* 8.3* 8.3*  MG 1.5*  --   --   --   --   --     Liver Function Tests: Recent Labs  Lab 11/28/17 1139  11/29/17 0333  AST 25 23  ALT 25 21  ALKPHOS 84 71  BILITOT 1.7* 2.3*  PROT 7.2 6.2*  ALBUMIN 3.3* 2.6*   No results for input(s): LIPASE, AMYLASE in the last 168 hours. No results for input(s): AMMONIA in the last 168 hours. Coagulation Profile: Recent Labs  Lab 11/28/17 1656  INR 1.29   Cardiac Enzymes: No results for input(s): CKTOTAL, CKMB, CKMBINDEX, TROPONINI in the last 168 hours. BNP (last 3 results) No results for input(s): PROBNP in the last 8760 hours. CBG: No results for input(s): GLUCAP in the last 168 hours. Studies: No results found.  Scheduled Meds: . amLODipine  5 mg Oral Daily  . doxycycline  100 mg Oral Q12H  .  enoxaparin (LOVENOX) injection  60 mg Subcutaneous Daily  . famotidine  20 mg Oral BID  . sodium chloride flush  3 mL Intravenous Q12H   Continuous Infusions: . sodium chloride 100 mL/hr at 12/01/17 0545   PRN Meds: acetaminophen **OR** acetaminophen, hydrALAZINE, HYDROcodone-acetaminophen, ondansetron **OR** ondansetron (ZOFRAN) IV, promethazine  Time spent: 35 minutes  Author: Berle Mull, MD [facility] Pager: [phone] 12/01/2017 3:26 PM  If 7PM-7AM, please contact night-coverage at [email or web address], password [facility]

## 2017-12-02 DIAGNOSIS — A4101 Sepsis due to Methicillin susceptible Staphylococcus aureus: Secondary | ICD-10-CM

## 2017-12-02 LAB — BASIC METABOLIC PANEL
ANION GAP: 10 (ref 5–15)
BUN: 14 mg/dL (ref 6–20)
CHLORIDE: 98 mmol/L — AB (ref 101–111)
CO2: 21 mmol/L — ABNORMAL LOW (ref 22–32)
Calcium: 8.6 mg/dL — ABNORMAL LOW (ref 8.9–10.3)
Creatinine, Ser: 1.25 mg/dL — ABNORMAL HIGH (ref 0.61–1.24)
GFR calc Af Amer: >60 mL/min (ref >60)
GFR calc non Af Amer: >60 mL/min (ref >60)
GLUCOSE: 90 mg/dL (ref 65–99)
Potassium: 4 mmol/L (ref 3.5–5.1)
SODIUM: 129 mmol/L — AB (ref 135–145)

## 2017-12-02 LAB — AEROBIC/ANAEROBIC CULTURE W GRAM STAIN (SURGICAL/DEEP WOUND)

## 2017-12-02 LAB — CBC
HEMATOCRIT: 41 % (ref 39.0–52.0)
HEMOGLOBIN: 14.1 g/dL (ref 13.0–17.0)
MCH: 31.3 pg (ref 26.0–34.0)
MCHC: 34.4 g/dL (ref 30.0–36.0)
MCV: 91.1 fL (ref 78.0–100.0)
Platelets: 302 10*3/uL (ref 150–400)
RBC: 4.5 MIL/uL (ref 4.22–5.81)
RDW: 12.5 % (ref 11.5–15.5)
WBC: 9.7 10*3/uL (ref 4.0–10.5)

## 2017-12-02 LAB — AEROBIC/ANAEROBIC CULTURE (SURGICAL/DEEP WOUND)

## 2017-12-02 MED ORDER — SACCHAROMYCES BOULARDII 250 MG PO CAPS
250.0000 mg | ORAL_CAPSULE | Freq: Two times a day (BID) | ORAL | Status: DC
  Administered 2017-12-02: 250 mg via ORAL
  Filled 2017-12-02: qty 1

## 2017-12-02 MED ORDER — DOXYCYCLINE HYCLATE 100 MG PO TABS: 100.0000 mg | ORAL_TABLET | Freq: Two times a day (BID) | ORAL | 0 refills | Status: AC

## 2017-12-02 MED ORDER — TRAMADOL HCL 50 MG PO TABS: 50.0000 mg | ORAL_TABLET | Freq: Four times a day (QID) | ORAL | 0 refills | Status: AC | PRN

## 2017-12-02 MED ORDER — FAMOTIDINE 20 MG PO TABS: 20.0000 mg | ORAL_TABLET | Freq: Every day | ORAL | 0 refills | Status: DC

## 2017-12-02 MED ORDER — SACCHAROMYCES BOULARDII 250 MG PO CAPS: 250.0000 mg | ORAL_CAPSULE | Freq: Two times a day (BID) | ORAL | 0 refills | Status: DC

## 2017-12-02 NOTE — Progress Notes (Signed)
Educated/Reviewed wet to dry dressing change with client's wife. Provided supplies, answered questions. Verbalized understanding of teaching. Client and spouse denied any any additional questions.

## 2017-12-02 NOTE — Progress Notes (Signed)
Pt./Spouse provided with discharge teaching, verbalized understanding, forms provided, IV removed, BP reassess -see flowsheet. Denied any pain or concerns. No distress noted. To be transferred to lobby via wheelchair. Neomia Dear, RN

## 2017-12-02 NOTE — Discharge Instructions (Signed)
Wound Check  If you have a wound, it may take some time to heal. Eventually, a scar will form. The scar will also fade with time. It is important to take care of your wound while it is healing. This helps to protect your wound from infection.  How should I take care of my wound at home?   Some wounds are allowed to close on their own or are repaired at a later date. There are many different ways to close and cover a wound, including stitches (sutures), skin glue, and adhesive strips. Follow your health care provider's instructions about:  ? Wound care.  ? Bandage (dressing) changes and removal.  ? Wound closure removal.   Take medicines only as directed by your health care provider.   Keep all follow-up visits as directed by your health care provider. This is important.   Do not take baths, swim, or use a hot tub until your health care provider approves. You may shower as directed by your health care provider.   Keep your wound clean and dry.  What affects scar formation?  Scars affect each person differently. How your body scars depends on:   The location and size of your wound.   Traits that you inherited from your parents (genetic predisposition).   How you take care of your wound. Irritation and inflammation increase the amount of scar formation.   Sun exposure. This can darken a scar.    When should I call or see my health care provider?  Call or see your health care provider if:   You have redness, swelling, or pain at your wound site.   You have fluid, blood, or pus coming from your wound.   You have muscle aches, chills, or a general ill feeling.   You notice a bad smell coming from the wound.   Your wound separates after the sutures, staples, or skin adhesive strips have been removed.   You have persistent nausea or vomiting.   You have a fever.   You are dizzy.    When should I call 911 or go to the emergency room?  Call 911 or go to the emergency room if:   You faint.   You have  difficulty breathing.    This information is not intended to replace advice given to you by your health care provider. Make sure you discuss any questions you have with your health care provider.  Document Released: 04/29/2004 Document Revised: 01/05/2016 Document Reviewed: 05/05/2014  [employer] Interactive Patient Education  2018 [employer]

## 2017-12-03 LAB — CULTURE, BLOOD (ROUTINE X 2)
CULTURE: NO GROWTH
Culture: NO GROWTH
Special Requests: ADEQUATE

## 2017-12-04 NOTE — Discharge Summary (Signed)
[facility] Discharge Summary   Patient: [name] [id number]   PCP: Rogers Blocker, MD DOB: Aug 03, 1961   Date of admission: 11/28/2017   Date of discharge: 12/02/2017     Discharge Diagnoses:  Active Problems:   Perineal abscess   Admitted From: Home Disposition: Home  Recommendations for Outpatient Follow-up:  1. Please follow-up with PCP in 1 week  Follow-up Information    Rogers Blocker, MD. Schedule an appointment as soon as possible for a visit in 1 week(s).   Specialty:  Internal Medicine Contact information: [place] [address] [phone]        Raynelle Bring, MD. Schedule an appointment as soon as possible for a visit in 2 week(s).   Specialty:  Urology Contact information: [place] [address] [phone]          Diet recommendation: cardiac diet  Activity: The patient is advised to gradually reintroduce usual activities.  Discharge Condition: good  Code Status: full code  History of present illness: As per the H and P dictated on admission, "HARREL FERRONE is a 57 y.o. male with Past medical history of HTN. Patient presents with complaints of swelling and redness of the scrotum ongoing since last 1 week.  Complains about having burning pain difficulty walking due to the swelling.  Also had fever with chills and nausea without any vomiting.  No abdominal pain no chest pain no diarrhea.  He reports that he had some bleeding from the cyst this morning. He mentions that he had sustained some skin tag after his back surgery a couple of years ago which he thinks is probably got infected. Denies any recent infections recent procedures."  Hospital Course:  Summary of his active problems in the hospital is as following. 1.Sepsis.  Due to scrotal abscess with cellulitis. No evidence of Fournier's gangrene. continue with IV fluids for now. Surgical culture is growing gram-positive rods as well as  gram-positive cocci, currently organism ID is Staphylococcus aureus.  Based on MRSA PCR negative as well as sensitivities from the superficial culture appears to have MSSA infection. Initially treated with IV vancomycin, IV Zosyn, after discussing with ID will switch him to Ancef IV.  Now that the patient's deep cultures also growing staph aureus change to oral doxycycline and discharged home. ID does not feel patient requires long-term IV antibiotics. Urology consulted.  S/P I and D, no mention of gangrene  2.Hypertension. Uncontrolled due to holding his home blood pressure medication perioperatively. Resuming Norvasc today as well as PRN hydralazine.  3.Hyponatremia. Sodium level still low.  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 Now better.   4.Cardiomegaly. Echocardiogram shows chronic diastolic dysfunction.  No other abnormality.  5.  Gastritis. X-ray abdomen shows postoperative gastritis.  Patient had severe nausea and vomiting postoperatively. Continue Pepcid. PRN Zofran. Advance diet right now.  All other chronic medical condition were stable during the hospitalization.  Patient was ambulatory without any assistance. On the day of the discharge the patient's vitals were stable , and no other acute medical condition were reported by patient. the patient was felt safe to be discharge at home with family.  Consultants: urology Procedures: Incision and debridement of the scrotal abscess  DISCHARGE MEDICATION: Allergies as of 12/02/2017   No Known Allergies     Medication List    TAKE these medications   ALEVE PM 220-25 MG Tabs Generic drug:  Naproxen Sod-diphenhydrAMINE Take 1 tablet by mouth at  bedtime as needed (for pain/sleep).   amLODipine 5 MG tablet Commonly known as:  NORVASC Take 5 mg by mouth daily.   aspirin EC 81 MG tablet Take 162-240 mg by mouth every 6 (six) hours as needed for mild pain or moderate pain.   doxycycline 100 MG  tablet Commonly known as:  VIBRA-TABS Take 1 tablet (100 mg total) by mouth every 12 (twelve) hours for 11 days.   famotidine 20 MG tablet Commonly known as:  PEPCID Take 1 tablet (20 mg total) by mouth daily.   saccharomyces boulardii 250 MG capsule Commonly known as:  FLORASTOR Take 1 capsule (250 mg total) by mouth 2 (two) times daily.   traMADol 50 MG tablet Commonly known as:  ULTRAM Take 1 tablet (50 mg total) by mouth every 6 (six) hours as needed.            Discharge Care Instructions  (From admission, onward)        Start     Ordered   12/02/17 0000  Discharge wound care:    Comments:  Wet to dry saline dressings with Kerlix TID.   12/02/17 1402     No Known Allergies Discharge Instructions    Diet - low sodium heart healthy   Complete by:  As directed    Discharge instructions   Complete by:  As directed    It is important that you read following instructions as well as go over your medication list with RN to help you understand your care after this hospitalization.  Discharge Instructions: Please follow-up with PCP in one week  Please request your primary care physician to go over all Hospital Tests and Procedure/Radiological results at the follow up,  Please get all Hospital records sent to your PCP by signing hospital release before you go home.   Do not drive, operating heavy machinery, perform activities at heights, swimming or participation in water activities or provide baby sitting services while you are on Pain, Sleep and Anxiety Medications; until you have been seen by Primary Care Physician or a Neurologist and advised to do so again. Do not take more than prescribed Pain, Sleep and Anxiety Medications. You were cared for by a hospitalist during your hospital stay. If you have any questions about your discharge medications or the care you received while you were in the hospital after you are discharged, you can call the unit and ask to speak with  the hospitalist on call if the hospitalist that took care of you is not available.  Once you are discharged, your primary care physician will handle any further medical issues. Please note that NO REFILLS for any discharge medications will be authorized once you are discharged, as it is imperative that you return to your primary care physician (or establish a relationship with a primary care physician if you do not have one) for your aftercare needs so that they can reassess your need for medications and monitor your lab values. You Must read complete instructions/literature along with all the possible adverse reactions/side effects for all the Medicines you take and that have been prescribed to you. Take any new Medicines after you have completely understood and accept all the possible adverse reactions/side effects. Wear Seat belts while driving. If you have smoked or chewed Tobacco in the last 2 yrs please stop smoking and/or stop any Recreational drug use.   Discharge wound care:   Complete by:  As directed    Wet to dry saline dressings  with Kerlix TID.   Increase activity slowly   Complete by:  As directed      Discharge Exam: Filed Weights   11/28/17 1047 11/28/17 2210  Weight: 121.1 kg (267 lb) 125.7 kg (277 lb 1.9 oz)   Vitals:   12/02/17 1428 12/02/17 1509  BP: (!) 158/102 (!) 158/94  Pulse:    Resp:    Temp:    SpO2:     General: Appear in no distress, no Rash; Oral Mucosa moist. Cardiovascular: S1 and S2 Present, no Murmur, no JVD Respiratory: Bilateral Air entry present and Clear to Auscultation, no Crackles, no wheezes Abdomen: Bowel Sound present, Soft and no tenderness Extremities: no Pedal edema, no calf tenderness Neurology: Grossly no focal neuro deficit.  The results of significant diagnostics from this hospitalization (including imaging, microbiology, ancillary and laboratory) are listed below for reference.    Significant Diagnostic Studies: Ct Pelvis W  Contrast  Result Date: 11/28/2017 CLINICAL DATA:  Scrotal swelling, perineal abscess. EXAM: CT PELVIS WITH CONTRAST TECHNIQUE: Multidetector CT imaging of the pelvis was performed using the standard protocol following the bolus administration of intravenous contrast. CONTRAST:  132mL ISOVUE-300 IOPAMIDOL (ISOVUE-300) INJECTION 61% COMPARISON:  None. FINDINGS: Urinary Tract:  No abnormality visualized. Bowel:  Unremarkable visualized pelvic bowel loops. Vascular/Lymphatic: Mildly enlarged right inguinal lymph nodes are noted which most likely are inflammatory in etiology. No significant vascular abnormality seen. Reproductive: No mass is noted. There is noted significant wall thickening and edema involving the scrotum. Other: Inflammatory changes are seen in the soft tissues extending from the right side of the scrotum into the right perineal tissues and right inguinal region consistent with cellulitis. No abscess or fluid collection is noted. No soft tissue gas is seen at this time. Musculoskeletal: No suspicious bone lesions identified. IMPRESSION: Severe wall thickening and edema is seen involving the scrotum. Inflammatory changes are seen extending from the scrotum and to the right perineal and inguinal soft tissues. No abscess or soft tissue gas is seen at this time. Enlarged right inguinal adenopathy is noted which most likely is inflammatory in etiology. These findings are consistent with cellulitis, but early or developing Fournier's gangrene cannot be excluded. Electronically Signed   By: Marijo Conception, M.D.   On: 11/28/2017 15:12   Dg Chest Port 1 View  Result Date: 11/28/2017 CLINICAL DATA:  Weakness, sepsis EXAM: PORTABLE CHEST 1 VIEW COMPARISON:  09/16/2010 FINDINGS: Cardiomegaly with vascular congestion. Interstitial prominence may reflect early interstitial edema. No confluent opacities or effusions. No acute bony abnormality. IMPRESSION: Cardiomegaly with vascular congestion and possible early  interstitial edema. Electronically Signed   By: Rolm Baptise M.D.   On: 11/28/2017 17:42   Dg Abd Portable 1v  Result Date: 11/29/2017 CLINICAL DATA:  Pt has been vomiting nonstop since about noon today. Severe nausea and abd pain with distention. Recent surgery yesterday. EXAM: PORTABLE ABDOMEN - 1 VIEW COMPARISON:  CT of the abdomen and pelvis 11/28/2017 FINDINGS: The stomach is distended. There is question of prominent gastric folds. Bowel gas pattern is nonobstructive. No evidence for organomegaly. Pelvic phleboliths are present. Previous lumbar fusion. No evidence for free air. Stable elevation of the RIGHT hemidiaphragm. IMPRESSION: Distension of the stomach. Possible rugal fold thickening raising the question of gastritis. Electronically Signed   By: Nolon Nations M.D.   On: 11/29/2017 18:58    Microbiology: Recent Results (from the past 240 hour(s))  Blood Culture (routine x 2)     Status: None   Collection  Time: 11/28/17 12:54 PM  Result Value Ref Range Status   Specimen Description   Final    BLOOD LEFT ANTECUBITAL Performed at [facility] [address] [place], [place] [address]    Special Requests   Final    BOTTLES DRAWN AEROBIC AND ANAEROBIC Blood Culture results may not be optimal due to an excessive volume of blood received in culture bottles Performed at [facility] [address], [place], [place] [address]    Culture   Final    NO GROWTH 5 DAYS Performed at [facility], [place] [address], [place], [place] [address]    Report Status 12/03/2017 FINAL  Final  Blood Culture (routine x 2)     Status: None   Collection Time: 11/28/17  1:11 PM  Result Value Ref Range Status   Specimen Description   Final    BLOOD RIGHT ANTECUBITAL Performed at [facility] [address], [place], [place] [address]    Special Requests   Final    BOTTLES DRAWN AEROBIC AND ANAEROBIC Blood Culture adequate  volume Performed at [facility] [address], [place], [place] [address]    Culture   Final    NO GROWTH 5 DAYS Performed at [facility], [place] [address] [place], [place] [address]    Report Status 12/03/2017 FINAL  Final  Wound or Superficial Culture     Status: None   Collection Time: 11/28/17  3:24 PM  Result Value Ref Range Status   Specimen Description   Final    ABSCESS WOUND Performed at [facility] [address], [place], [place] [address]    Special Requests   Final    NONE Performed at [facility], [place] [address] [place], [place] [address]    Gram Stain   Final    RARE WBC PRESENT, PREDOMINANTLY PMN FEW GRAM POSITIVE COCCI RARE GRAM NEGATIVE RODS Performed at [facility], [place] [address], [place], [place] [address]    Culture ABUNDANT STAPHYLOCOCCUS AUREUS  Final   Report Status 11/30/2017 FINAL  Final   Organism ID, Bacteria STAPHYLOCOCCUS AUREUS  Final      Susceptibility   Staphylococcus aureus - MIC*    CIPROFLOXACIN >=8 RESISTANT Resistant     ERYTHROMYCIN >=8 RESISTANT Resistant     GENTAMICIN <=0.5 SENSITIVE Sensitive     OXACILLIN 0.5 SENSITIVE Sensitive     TETRACYCLINE <=1 SENSITIVE Sensitive     VANCOMYCIN 1 SENSITIVE Sensitive     TRIMETH/SULFA <=10 SENSITIVE Sensitive     CLINDAMYCIN <=0.25 SENSITIVE Sensitive     RIFAMPIN <=0.5 SENSITIVE Sensitive     Inducible Clindamycin NEGATIVE Sensitive     * ABUNDANT STAPHYLOCOCCUS AUREUS  Aerobic/Anaerobic Culture (surgical/deep wound)     Status: None   Collection Time: 11/28/17  8:47 PM  Result Value Ref Range Status   Specimen Description   Final    ABSCESS SCROTUM Performed at [facility] [address] [place], [place] [address]    Special Requests   Final    NONE Performed at [facility], [place] [address] [place], [place] [address]    Gram Stain   Final    MODERATE WBC  PRESENT, PREDOMINANTLY PMN FEW GRAM POSITIVE COCCI RARE GRAM POSITIVE RODS    Culture   Final    ABUNDANT STAPHYLOCOCCUS AUREUS NO ANAEROBES ISOLATED Performed at [facility],  [address] [place], [place] [address]    Report Status 12/02/2017 FINAL  Final   Organism ID, Bacteria STAPHYLOCOCCUS AUREUS  Final      Susceptibility   Staphylococcus aureus - MIC*    CIPROFLOXACIN >=8 RESISTANT Resistant     ERYTHROMYCIN >=8 RESISTANT Resistant     GENTAMICIN <=0.5 SENSITIVE Sensitive     OXACILLIN 0.5 SENSITIVE Sensitive     TETRACYCLINE <=1 SENSITIVE Sensitive     VANCOMYCIN <=0.5 SENSITIVE Sensitive     TRIMETH/SULFA <=10 SENSITIVE Sensitive     CLINDAMYCIN <=0.25 SENSITIVE Sensitive     RIFAMPIN <=0.5 SENSITIVE Sensitive     Inducible Clindamycin NEGATIVE Sensitive     * ABUNDANT STAPHYLOCOCCUS AUREUS  MRSA PCR Screening     Status: None   Collection Time: 11/28/17 10:13 PM  Result Value Ref Range Status   MRSA by PCR NEGATIVE NEGATIVE Final    Comment:        The GeneXpert MRSA Assay (FDA approved for NASAL specimens only), is one component of a comprehensive MRSA colonization surveillance program. It is not intended to diagnose MRSA infection nor to guide or monitor treatment for MRSA infections. Performed at [facility], [place] [address] [place], [place] [address]      Labs: CBC: Recent Labs  Lab 11/28/17 1139 11/28/17 1301 11/29/17 0333 11/30/17 0419 12/01/17 0355 12/02/17 0406  WBC 24.1*  --  23.0* 19.1* 11.8* 9.7  NEUTROABS 20.4*  --  21.3*  --   --   --   HGB 15.6 16.0 13.7 13.1 13.3 14.1  HCT 44.1 47.0 39.9 38.1* 38.9* 41.0  MCV 91.3  --  91.9 91.8 91.1 91.1  PLT 245  --  235 236 282 546   Basic Metabolic Panel: Recent Labs  Lab 11/29/17 0333  11/29/17 1458 11/29/17 1940 11/30/17 0419 12/01/17 0355 12/02/17 0406  NA 125*   < > 129* 132* 132* 129* 129*  K 4.9   < > 4.3 4.5 4.1 3.8 4.0  CL 98*   < > 98* 99* 101 98*  98*  CO2 18*   < > 21* 22 22 21* 21*  GLUCOSE 146*   < > 162* 132* 122* 81 90  BUN 15   < > 20 19 20 15 14   CREATININE 1.27*   < > 1.35* 1.33* 1.29* 1.23 1.25*  CALCIUM 8.0*   < > 8.5* 8.5* 8.3* 8.3* 8.6*  MG 1.5*  --   --   --   --   --   --    < > = values in this interval not displayed.   Liver Function Tests: Recent Labs  Lab 11/28/17 1139 11/29/17 0333  AST 25 23  ALT 25 21  ALKPHOS 84 71  BILITOT 1.7* 2.3*  PROT 7.2 6.2*  ALBUMIN 3.3* 2.6*   Time spent: 35 minutes  Signed:  Berle Mull  [facility] 12/02/2017 , 7:04 PM

## 2017-12-06 ENCOUNTER — Ambulatory Visit (INDEPENDENT_AMBULATORY_CARE_PROVIDER_SITE_OTHER): Payer: Self-pay | Admitting: Specialist

## 2018-05-16 ENCOUNTER — Other Ambulatory Visit: Payer: Self-pay | Admitting: Internal Medicine

## 2018-05-16 DIAGNOSIS — N183 Chronic kidney disease, stage 3 unspecified: Secondary | ICD-10-CM

## 2018-05-22 ENCOUNTER — Ambulatory Visit
Admission: RE | Admit: 2018-05-22 | Discharge: 2018-05-22 | Disposition: A | Discharge status: Home / Self Care | Payer: Medicare Other | Source: Ambulatory Visit | Attending: Internal Medicine | Admitting: Internal Medicine

## 2018-05-22 DIAGNOSIS — N183 Chronic kidney disease, stage 3 unspecified: Secondary | ICD-10-CM

## 2018-06-11 ENCOUNTER — Other Ambulatory Visit: Payer: Self-pay | Admitting: Gastroenterology

## 2018-06-11 DIAGNOSIS — Z800 Family history of malignant neoplasm of digestive organs: Secondary | ICD-10-CM

## 2018-06-11 DIAGNOSIS — Z1211 Encounter for screening for malignant neoplasm of colon: Secondary | ICD-10-CM

## 2018-06-21 ENCOUNTER — Ambulatory Visit
Admission: RE | Admit: 2018-06-21 | Discharge: 2018-06-21 | Disposition: A | Discharge status: Home / Self Care | Payer: Medicare Other | Source: Ambulatory Visit | Attending: Gastroenterology | Admitting: Gastroenterology

## 2018-06-21 DIAGNOSIS — Z800 Family history of malignant neoplasm of digestive organs: Secondary | ICD-10-CM

## 2018-06-21 DIAGNOSIS — Z1211 Encounter for screening for malignant neoplasm of colon: Secondary | ICD-10-CM

## 2019-05-23 ENCOUNTER — Other Ambulatory Visit: Payer: Self-pay

## 2019-05-23 DIAGNOSIS — I872 Venous insufficiency (chronic) (peripheral): Secondary | ICD-10-CM

## 2019-05-27 ENCOUNTER — Other Ambulatory Visit: Payer: Self-pay

## 2019-05-27 ENCOUNTER — Ambulatory Visit (INDEPENDENT_AMBULATORY_CARE_PROVIDER_SITE_OTHER): Payer: Medicare Other | Admitting: Vascular Surgery

## 2019-05-27 ENCOUNTER — Encounter: Payer: Self-pay | Admitting: Vascular Surgery

## 2019-05-27 ENCOUNTER — Ambulatory Visit (HOSPITAL_COMMUNITY)
Admission: RE | Admit: 2019-05-27 | Discharge: 2019-05-27 | Disposition: A | Discharge status: Home / Self Care | Payer: Medicare Other | Source: Ambulatory Visit | Attending: Family | Admitting: Family

## 2019-05-27 DIAGNOSIS — I872 Venous insufficiency (chronic) (peripheral): Secondary | ICD-10-CM | POA: Insufficient documentation

## 2019-05-27 DIAGNOSIS — I890 Lymphedema, not elsewhere classified: Secondary | ICD-10-CM | POA: Diagnosis not present

## 2019-05-27 NOTE — Progress Notes (Signed)
Patient name: [name] MRN: [id number] DOB: 24-Feb-1961 Sex: male  REASON FOR CONSULT: Evaluate for bilateral lower extremity venous insufficiency  HPI: [name] is a 58 y.o. male, with history of multiple back surgeries, as well as hypertension, CKD, OSA, and obesity presents for evaluation of possible bilateral lower extremity venous insufficiency.  He states he's had progressive swelling of both lower extremities over the last year.  He does have compression socks and wears knee-high compression intermittently.  Currently his BMI is 38 and he admits he is overweight.  He has had no lower extremity interventions in the past.  No open ulcerations.  Noticed a skin indentation on the front of his left shin.  States in discussing with his wife elected to come see a vascular surgeon as possible etiology for swelling could be circulatory.  No previous DVT.  Past Medical History:  Diagnosis Date  . Hypertension     Past Surgical History:  Procedure Laterality Date  . BACK SURGERY    . c1-c7 surgery    . IRRIGATION AND DEBRIDEMENT ABSCESS N/A 11/28/2017   Procedure: IRRIGATION AND DEBRIDEMENT SCROTAL AND PERINEAL ABSCESS;  Surgeon: Raynelle Bring, MD;  Location: [facility];  Service: Urology;  Laterality: N/A;  . kidney removed      History reviewed. No pertinent family history.  SOCIAL HISTORY: Social History   Socioeconomic History  . Marital status: Divorced    Spouse name: Not on file  . Number of children: Not on file  . Years of education: Not on file  . Highest education level: Not on file  Occupational History  . Not on file  Social Needs  . Financial resource strain: Not on file  . Food insecurity    Worry: Not on file    Inability: Not on file  . Transportation needs    Medical: Not on file    Non-medical: Not on file  Tobacco Use  . Smoking status: Never Smoker  . Smokeless tobacco: Never Used  Substance and Sexual Activity  . Alcohol use: No  . Drug use:  Not on file  . Sexual activity: Not on file  Lifestyle  . Physical activity    Days per week: Not on file    Minutes per session: Not on file  . Stress: Not on file  Relationships  . Social Herbalist on phone: Not on file    Gets together: Not on file    Attends religious service: Not on file    Active member of club or organization: Not on file    Attends meetings of clubs or organizations: Not on file    Relationship status: Not on file  . Intimate partner violence    Fear of current or ex partner: Not on file    Emotionally abused: Not on file    Physically abused: Not on file    Forced sexual activity: Not on file  Other Topics Concern  . Not on file  Social History Narrative  . Not on file    No Known Allergies  Current Outpatient Medications  Medication Sig Dispense Refill  . amLODipine (NORVASC) 5 MG tablet Take 5 mg by mouth daily.    Marland Kitchen aspirin EC 81 MG tablet Take 162-240 mg by mouth every 6 (six) hours as needed for mild pain or moderate pain.    . famotidine (PEPCID) 20 MG tablet Take 1 tablet (20 mg total) by mouth daily. 15 tablet 0  .  Naproxen Sod-diphenhydrAMINE (ALEVE PM) 220-25 MG TABS Take 1 tablet by mouth at bedtime as needed (for pain/sleep).    . saccharomyces boulardii (FLORASTOR) 250 MG capsule Take 1 capsule (250 mg total) by mouth 2 (two) times daily. 30 capsule 0   No current facility-administered medications for this visit.     REVIEW OF SYSTEMS:  [X]  denotes positive finding, [ ]  denotes negative finding Cardiac  Comments:  Chest pain or chest pressure:    Shortness of breath upon exertion:    Short of breath when lying flat:    Irregular heart rhythm:        Vascular    Pain in calf, thigh, or hip brought on by ambulation:    Pain in feet at night that wakes you up from your sleep:     Blood clot in your veins:    Leg swelling:  x Bilaterally      Pulmonary    Oxygen at home:    Productive cough:     Wheezing:          Neurologic    Sudden weakness in arms or legs:     Sudden numbness in arms or legs:     Sudden onset of difficulty speaking or slurred speech:    Temporary loss of vision in one eye:     Problems with dizziness:         Gastrointestinal    Blood in stool:     Vomited blood:         Genitourinary    Burning when urinating:     Blood in urine:        Psychiatric    Major depression:         Hematologic    Bleeding problems:    Problems with blood clotting too easily:        Skin    Rashes or ulcers:        Constitutional    Fever or chills:      PHYSICAL EXAM: Vitals:   05/27/19 1448  BP: (!) 154/104  Pulse: 72  Resp: 18  Temp: (!) 97.2 F (36.2 C)  TempSrc: Temporal  SpO2: 100%  Weight: 269 lb (122 kg)  Height: 5\' 10"  (1.778 m)    GENERAL: The patient is a well-nourished male, in no acute distress. The vital signs are documented above. CARDIAC: There is a regular rate and rhythm.  VASCULAR:  2+ palpable dorsalis pedis pulse bilateral lower extremity 3+ pitting edema bilateral feet No active ulcerations PULMONARY: There is good air exchange bilaterally without wheezing or rales. ABDOMEN: Soft and non-tender with normal pitched bowel sounds.  MUSCULOSKELETAL: There are no major deformities or cyanosis. NEUROLOGIC: No focal weakness or paresthesias are detected. SKIN: There are no ulcers or rashes noted. PSYCHIATRIC: The patient has a normal affect.  DATA:   Bilateral lower extremity reflux study shows no evidence of superficial or deep reflux in the bilateral lower extremities  Assessment/Plan:  58 year old male who presents for evaluation evaluation of bilateral lower extremity swelling and suspected venous insufficiency.  I discussed in detail with him that based on his venous reflux study today he has no evidence of superficial or deep venous reflux.  Assuming there is no other systemic etiology like heart failure or other centralized process there may  be a large component of lymphedema.  Discussed the importance of weight loss given that he is obese and this would significantly benefit him.  Also needs to work  on elevating his legs and compression with other conservative measures.  No role for vascular intervention at this time.  In addition he has palpable pedal pulses suggesting no underlying arterial disease.   Marty Heck, MD [facility] Office: [phone] Pager: [phone]

## 2019-11-06 ENCOUNTER — Ambulatory Visit: Payer: Medicare Other | Attending: Internal Medicine

## 2019-11-06 DIAGNOSIS — Z23 Encounter for immunization: Secondary | ICD-10-CM

## 2019-11-06 NOTE — Progress Notes (Signed)
   Covid-19 Vaccination Clinic  Name:  [name]    MRN: [id number] DOB: 1961/03/23  11/06/2019  [name] was observed post Covid-19 immunization for 15 minutes without incident. He was provided with Vaccine Information Sheet and instruction to access the V-Safe system.   [name] was instructed to call 911 with any severe reactions post vaccine: Marland Kitchen Difficulty breathing  . Swelling of face and throat  . A fast heartbeat  . A bad rash all over body  . Dizziness and weakness   Immunizations Administered    Name Date Dose VIS Date Route   Pfizer COVID-19 Vaccine 11/06/2019 10:05 AM 0.3 mL 07/18/2019 Intramuscular   Manufacturer: [employer], [employer]   Lot: [id number]   [place]: [id number]

## 2019-12-01 ENCOUNTER — Ambulatory Visit: Payer: Medicare Other | Attending: Internal Medicine

## 2019-12-01 DIAGNOSIS — Z23 Encounter for immunization: Secondary | ICD-10-CM

## 2019-12-01 NOTE — Progress Notes (Signed)
   Covid-19 Vaccination Clinic  Name:  ABDULKAREEM GOODLY    MRN: [id number] DOB: 1961-07-23  12/01/2019  [name] was observed post Covid-19 immunization for 15 minutes without incident. He was provided with Vaccine Information Sheet and instruction to access the V-Safe system.   [name] was instructed to call 911 with any severe reactions post vaccine: Marland Kitchen Difficulty breathing  . Swelling of face and throat  . A fast heartbeat  . A bad rash all over body  . Dizziness and weakness   Immunizations Administered    Name Date Dose VIS Date Route   Pfizer COVID-19 Vaccine 12/01/2019  9:18 AM 0.3 mL 10/01/2018 Intramuscular   Manufacturer: [employer]   Lot: [id number]   [place]: [id number]

## 2020-10-08 DIAGNOSIS — I10 Essential (primary) hypertension: Secondary | ICD-10-CM | POA: Diagnosis not present

## 2020-10-08 DIAGNOSIS — G4733 Obstructive sleep apnea (adult) (pediatric): Secondary | ICD-10-CM | POA: Diagnosis not present

## 2020-10-08 DIAGNOSIS — N183 Chronic kidney disease, stage 3 unspecified: Secondary | ICD-10-CM | POA: Diagnosis not present

## 2020-10-08 DIAGNOSIS — I872 Venous insufficiency (chronic) (peripheral): Secondary | ICD-10-CM | POA: Diagnosis not present

## 2021-01-11 DIAGNOSIS — G4733 Obstructive sleep apnea (adult) (pediatric): Secondary | ICD-10-CM | POA: Diagnosis not present

## 2021-02-23 DIAGNOSIS — G4733 Obstructive sleep apnea (adult) (pediatric): Secondary | ICD-10-CM | POA: Diagnosis not present

## 2021-04-27 DIAGNOSIS — N183 Chronic kidney disease, stage 3 unspecified: Secondary | ICD-10-CM | POA: Diagnosis not present

## 2021-04-27 DIAGNOSIS — I872 Venous insufficiency (chronic) (peripheral): Secondary | ICD-10-CM | POA: Diagnosis not present

## 2021-04-27 DIAGNOSIS — Z23 Encounter for immunization: Secondary | ICD-10-CM | POA: Diagnosis not present

## 2021-04-27 DIAGNOSIS — Z0000 Encounter for general adult medical examination without abnormal findings: Secondary | ICD-10-CM | POA: Diagnosis not present

## 2021-04-27 DIAGNOSIS — I10 Essential (primary) hypertension: Secondary | ICD-10-CM | POA: Diagnosis not present

## 2021-04-27 DIAGNOSIS — G4733 Obstructive sleep apnea (adult) (pediatric): Secondary | ICD-10-CM | POA: Diagnosis not present

## 2021-04-27 DIAGNOSIS — M519 Unspecified thoracic, thoracolumbar and lumbosacral intervertebral disc disorder: Secondary | ICD-10-CM | POA: Diagnosis not present

## 2021-04-27 DIAGNOSIS — Z1389 Encounter for screening for other disorder: Secondary | ICD-10-CM | POA: Diagnosis not present

## 2021-10-28 DIAGNOSIS — N183 Chronic kidney disease, stage 3 unspecified: Secondary | ICD-10-CM | POA: Diagnosis not present

## 2021-10-28 DIAGNOSIS — G4733 Obstructive sleep apnea (adult) (pediatric): Secondary | ICD-10-CM | POA: Diagnosis not present

## 2021-10-28 DIAGNOSIS — M519 Unspecified thoracic, thoracolumbar and lumbosacral intervertebral disc disorder: Secondary | ICD-10-CM | POA: Diagnosis not present

## 2021-10-28 DIAGNOSIS — I10 Essential (primary) hypertension: Secondary | ICD-10-CM | POA: Diagnosis not present

## 2021-11-23 DIAGNOSIS — G4733 Obstructive sleep apnea (adult) (pediatric): Secondary | ICD-10-CM | POA: Diagnosis not present

## 2022-05-03 DIAGNOSIS — I10 Essential (primary) hypertension: Secondary | ICD-10-CM | POA: Diagnosis not present

## 2022-05-03 DIAGNOSIS — I872 Venous insufficiency (chronic) (peripheral): Secondary | ICD-10-CM | POA: Diagnosis not present

## 2022-05-03 DIAGNOSIS — G4733 Obstructive sleep apnea (adult) (pediatric): Secondary | ICD-10-CM | POA: Diagnosis not present

## 2022-05-03 DIAGNOSIS — D179 Benign lipomatous neoplasm, unspecified: Secondary | ICD-10-CM | POA: Diagnosis not present

## 2022-05-03 DIAGNOSIS — N183 Chronic kidney disease, stage 3 unspecified: Secondary | ICD-10-CM | POA: Diagnosis not present

## 2022-05-03 DIAGNOSIS — Z1389 Encounter for screening for other disorder: Secondary | ICD-10-CM | POA: Diagnosis not present

## 2022-05-03 DIAGNOSIS — Z0000 Encounter for general adult medical examination without abnormal findings: Secondary | ICD-10-CM | POA: Diagnosis not present

## 2022-05-17 DIAGNOSIS — D1720 Benign lipomatous neoplasm of skin and subcutaneous tissue of unspecified limb: Secondary | ICD-10-CM | POA: Diagnosis not present

## 2022-05-19 ENCOUNTER — Ambulatory Visit: Payer: Self-pay | Admitting: General Surgery

## 2022-05-19 NOTE — Progress Notes (Signed)
Sent message, via epic in basket, requesting orders in epic from surgeon.  

## 2022-05-21 NOTE — Progress Notes (Signed)
COVID Vaccine received:  '[]'$  No '[x]'$  Yes Date of any COVID positive Test in last 90 days:  PCP - Kevan Ny, MD Cardiologist -   Chest x-ray -  EKG -  11-28-2017   Repeat at PST Stress Test -  ECHO -  Cardiac Cath -   Pacemaker/ICD device     '[]'$  N/A Spinal Cord Stimulator:'[]'$  No '[]'$  Yes      (Remind patient to bring remote DOS) Other Implants:   History of Sleep Apnea? '[]'$  No '[x]'$  Yes   Sleep Study Date:   CPAP used?- '[]'$  No '[x]'$  Yes  (Instruct to bring their mask & Tubing)  Does the patient monitor blood sugar? '[]'$  No '[]'$  Yes  '[]'$  N/A Does patient have a [employer] or Dexacom? '[]'$  No '[]'$  Yes   Fasting Blood Sugar Ranges-  Checks Blood Sugar _____ times a day  Blood Thinner Instructions: Aspirin Instructions: Last Dose:  ERAS Protocol Ordered: '[]'$  No  '[x]'$  Yes  No Drink ordered  Comments: Hx Multiple lumbar back surgeries L3-S1, Hx Cervical laminectomy C1-C7 (2008)  Activity level: Patient can / can not climb a flight of stairs without difficulty;  '[]'$  No CP  '[]'$  No SOB,  but would have ______   Anesthesia review: CKD3 (Right nephrectomy age 72 for nonfunctioning kidney),  HTN  Patient denies shortness of breath, fever, cough and chest pain at PAT appointment.  Patient verbalized understanding and agreement to the Pre-Surgical Instructions that were given to them at this PAT appointment. Patient was also educated of the need to review these PAT instructions again prior to his/her surgery.I reviewed the appropriate phone numbers to call if they have any and questions or concerns.

## 2022-05-21 NOTE — Patient Instructions (Signed)
SURGICAL WAITING ROOM VISITATION Patients having surgery or a procedure may have no more than 2 support people in the waiting area - these visitors may rotate in the visitor waiting room.   Children under the age of 52 must have an adult with them who is not the patient. If the patient needs to stay at the hospital during part of their recovery, the visitor guidelines for inpatient rooms apply.  PRE-OP VISITATION  Pre-op nurse will coordinate an appropriate time for 1 support person to accompany the patient in pre-op.  This support person may not rotate.  This visitor will be contacted when the time is appropriate for the visitor to come back in the pre-op area.  Please refer to the [facility] website for the visitor guidelines for Inpatients (after your surgery is over and you are in a regular room).  You are not required to quarantine at this time prior to your surgery. However, you must do this: Hand Hygiene often Do NOT share personal items Notify your provider if you are in close contact with someone who has COVID or you develop fever 100.4 or greater, new onset of sneezing, cough, sore throat, shortness of breath or body aches.   If you received a COVID test during your pre-op visit  it is requested that you wear a mask when out in public, stay away from anyone that may not be feeling well and notify your surgeon if you develop symptoms. If you test positive for Covid or have been in contact with anyone that has tested positive in the last 10 days please notify you surgeon.       Your procedure is scheduled on:  Thursday  May 25, 2022  Report to [facility] Main Entrance.  Report to admitting at:  07:15   AM  +++++Call this number if you have any questions or problems the morning of surgery [phone]  Do not eat food :After Midnight the night prior to your surgery/procedure.  After Midnight you may have the following liquids until    06:30 AM  DAY OF  SURGERY  Clear Liquid Diet Water Black Coffee (sugar ok, NO MILK/CREAM OR CREAMERS)  Tea (sugar ok, NO MILK/CREAM OR CREAMERS) regular and decaf                             Plain Jell-O  with no fruit (NO RED)                                           Fruit ices (not with fruit pulp, NO RED)                                     Popsicles (NO RED)                                                                  Juice: apple, WHITE grape, WHITE cranberry Sports drinks like Gatorade or Powerade (NO RED)  FOLLOW BOWEL PREP AND ANY ADDITIONAL PRE OP INSTRUCTIONS YOU RECEIVED FROM YOUR SURGEON'S OFFICE!!!   Oral Hygiene is also important to reduce your risk of infection.        Remember - BRUSH YOUR TEETH THE MORNING OF SURGERY WITH YOUR REGULAR TOOTHPASTE  Do NOT smoke after Midnight the night before surgery.  Take ONLY these medicines the morning of surgery with A SIP OF WATER: ?????   If You have been diagnosed with Sleep Apnea - Bring CPAP mask and tubing day of surgery. We will provide you with a CPAP machine on the day of your surgery.                   You may not have any metal on your body including  jewelry, and body piercing  Do not wear lotions, powders, cologne, or deodorant  Men may shave face and neck.  Contacts, Hearing Aids, dentures or bridgework may not be worn into surgery.    DO NOT Levan. PHARMACY WILL DISPENSE MEDICATIONS LISTED ON YOUR MEDICATION LIST TO YOU DURING YOUR ADMISSION [place]!   Patients discharged on the day of surgery will not be allowed to drive home.  Someone NEEDS to stay with you for the first 24 hours after anesthesia.  Special Instructions: Bring a copy of your healthcare power of attorney and living will documents the day of surgery, if you wish to have them scanned into your [place] Medical Records- EPIC  Please read over the following fact sheets you were given: IF YOU  HAVE QUESTIONS ABOUT YOUR PRE-OP INSTRUCTIONS, PLEASE CALL [phone]  ([place])   [facility] - Preparing for Surgery Before surgery, you can play an important role.  Because skin is not sterile, your skin needs to be as free of germs as possible.  You can reduce the number of germs on your skin by washing with CHG (chlorahexidine gluconate) soap before surgery.  CHG is an antiseptic cleaner which kills germs and bonds with the skin to continue killing germs even after washing. Please DO NOT use if you have an allergy to CHG or antibacterial soaps.  If your skin becomes reddened/irritated stop using the CHG and inform your nurse when you arrive at Short Stay. Do not shave (including legs and underarms) for at least 48 hours prior to the first CHG shower.  You may shave your face/neck.  Please follow these instructions carefully:  1.  Shower with CHG Soap the night before surgery and the  morning of surgery.  2.  If you choose to wash your hair, wash your hair first as usual with your normal  shampoo.  3.  After you shampoo, rinse your hair and body thoroughly to remove the shampoo.                             4.  Use CHG as you would any other liquid soap.  You can apply chg directly to the skin and wash.  Gently with a scrungie or clean washcloth.  5.  Apply the CHG Soap to your body ONLY FROM THE NECK DOWN.   Do not use on face/ open                           Wound or open sores. Avoid contact with eyes, ears mouth and genitals (private parts).  Wash face,  Genitals (private parts) with your normal soap.             6.  Wash thoroughly, paying special attention to the area where your  surgery  will be performed.  7.  Thoroughly rinse your body with warm water from the neck down.  8.  DO NOT shower/wash with your normal soap after using and rinsing off the CHG Soap.            9.  Pat yourself dry with a clean towel.            10.  Wear clean pajamas.            11.  Place  clean sheets on your bed the night of your first shower and do not  sleep with pets.  ON THE DAY OF SURGERY : Do not apply any lotions/deodorants the morning of surgery.  Please wear clean clothes to the hospital/surgery center.    FAILURE TO FOLLOW THESE INSTRUCTIONS MAY RESULT IN THE CANCELLATION OF YOUR SURGERY  PATIENT SIGNATURE_________________________________  NURSE SIGNATURE__________________________________  ________________________________________________________________________

## 2022-05-23 ENCOUNTER — Inpatient Hospital Stay (HOSPITAL_COMMUNITY)
Admission: RE | Admit: 2022-05-23 | Discharge: 2022-05-23 | Disposition: A | Discharge status: Home / Self Care | Payer: Medicare Other | Source: Ambulatory Visit

## 2022-05-23 DIAGNOSIS — I10 Essential (primary) hypertension: Secondary | ICD-10-CM

## 2022-05-25 ENCOUNTER — Encounter (HOSPITAL_COMMUNITY): Admission: RE | Payer: Self-pay | Source: Home / Self Care

## 2022-05-25 ENCOUNTER — Ambulatory Visit (HOSPITAL_COMMUNITY): Admission: RE | Admit: 2022-05-25 | Payer: Medicare Other | Source: Home / Self Care | Admitting: General Surgery

## 2022-05-25 DIAGNOSIS — I10 Essential (primary) hypertension: Secondary | ICD-10-CM

## 2022-05-25 SURGERY — EXCISION LIPOMA
Anesthesia: Monitor Anesthesia Care | Site: Shoulder | Laterality: Right

## 2023-02-22 DIAGNOSIS — I872 Venous insufficiency (chronic) (peripheral): Secondary | ICD-10-CM | POA: Diagnosis not present

## 2023-02-22 DIAGNOSIS — I10 Essential (primary) hypertension: Secondary | ICD-10-CM | POA: Diagnosis not present

## 2023-02-22 DIAGNOSIS — N1831 Chronic kidney disease, stage 3a: Secondary | ICD-10-CM | POA: Diagnosis not present

## 2023-02-22 DIAGNOSIS — I8310 Varicose veins of unspecified lower extremity with inflammation: Secondary | ICD-10-CM | POA: Diagnosis not present

## 2023-03-21 DIAGNOSIS — N1831 Chronic kidney disease, stage 3a: Secondary | ICD-10-CM | POA: Diagnosis not present

## 2023-03-21 DIAGNOSIS — I10 Essential (primary) hypertension: Secondary | ICD-10-CM | POA: Diagnosis not present

## 2023-04-20 ENCOUNTER — Other Ambulatory Visit: Payer: Self-pay | Admitting: *Deleted

## 2023-04-20 DIAGNOSIS — I890 Lymphedema, not elsewhere classified: Secondary | ICD-10-CM

## 2023-04-23 ENCOUNTER — Ambulatory Visit (HOSPITAL_COMMUNITY)
Admission: RE | Admit: 2023-04-23 | Discharge: 2023-04-23 | Disposition: A | Discharge status: Home / Self Care | Payer: Medicare Other | Source: Ambulatory Visit | Attending: Surgery | Admitting: Surgery

## 2023-04-23 DIAGNOSIS — I890 Lymphedema, not elsewhere classified: Secondary | ICD-10-CM

## 2023-04-30 DIAGNOSIS — N1831 Chronic kidney disease, stage 3a: Secondary | ICD-10-CM | POA: Diagnosis not present

## 2023-04-30 DIAGNOSIS — Z9989 Dependence on other enabling machines and devices: Secondary | ICD-10-CM | POA: Diagnosis not present

## 2023-04-30 DIAGNOSIS — Z23 Encounter for immunization: Secondary | ICD-10-CM | POA: Diagnosis not present

## 2023-04-30 DIAGNOSIS — I10 Essential (primary) hypertension: Secondary | ICD-10-CM | POA: Diagnosis not present

## 2023-05-08 ENCOUNTER — Ambulatory Visit: Payer: Medicare Other | Admitting: Physician Assistant

## 2023-05-08 VITALS — BP 148/105 | HR 66 | Temp 98.3°F | Wt 283.0 lb

## 2023-05-08 DIAGNOSIS — M7989 Other specified soft tissue disorders: Secondary | ICD-10-CM

## 2023-05-08 NOTE — Progress Notes (Signed)
VASCULAR & VEIN SPECIALISTS           [facility] [place]  History and Physical   [name] is a 61 y.o. male who presents with BLE swelling with left worse than right.  He was seen by Dr. Chestine Spore in 2020 for BLE edema.  At that time, he did not have any venous reflux in the deep or superficial systems.  He had palpable pedal pulses.    He was seen by his PCP on 04/30/2023 and at that time, he had CKD III with creatinine of 1.67.  He was also prescribed 30-40 mmHg compression socks.   He states that he has had some leg swelling since 2003.  He has never had any procedures on his veins.  He does not have family hx of leg swelling.  He does not have hx of DVT.  He does have skin color changes bilateral lower legs.  He does use compression socks without the toes.  He states he does wear some ankle high compression socks, which helps with the swelling in his feet.  He does not have any wound on his feet.  He states the swelling is better when he has been in bed overnight.  He states that they took him off the Amlodipine for the swelling of his feet.  He is now on a diuretic.   His wife passed away last year and was a PAD and dialysis pt here at our office.    The pt is not on a statin for cholesterol management.  The pt is on a daily aspirin.   Other AC:  none The pt is on diuretic, BB, for hypertension.   The pt is not on medication for diabetes.   Tobacco hx:  never   Past Medical History:  Diagnosis Date   Hypertension     Past Surgical History:  Procedure Laterality Date   BACK SURGERY     c1-c7 surgery     IRRIGATION AND DEBRIDEMENT ABSCESS N/A 11/28/2017   Procedure: IRRIGATION AND DEBRIDEMENT SCROTAL AND PERINEAL ABSCESS;  Surgeon: Heloise Purpura, MD;  Location: [facility];  Service: Urology;  Laterality: N/A;   kidney removed      Social History   Socioeconomic History   Marital status: Widowed    Spouse name: Not on file   Number of children: Not on file    Years of education: Not on file   Highest education level: Not on file  Occupational History   Not on file  Tobacco Use   Smoking status: Never   Smokeless tobacco: Never  Substance and Sexual Activity   Alcohol use: No   Drug use: Not on file   Sexual activity: Not on file  Other Topics Concern   Not on file  Social History Narrative   Not on file   Social Determinants of Health   Financial Resource Strain: Not on file  Food Insecurity: Not on file  Transportation Needs: Not on file  Physical Activity: Not on file  Stress: Not on file  Social Connections: Not on file  Intimate Partner Violence: Not on file   FH: Father:  CAD, DM Mother:  HTN  Current Outpatient Medications  Medication Sig Dispense Refill   amLODipine (NORVASC) 5 MG tablet Take 5 mg by mouth daily.     No current facility-administered medications for this visit.    No Known Allergies  REVIEW OF SYSTEMS:   [X]  denotes  positive finding, [ ]  denotes negative finding Cardiac  Comments:  Chest pain or chest pressure:    Shortness of breath upon exertion:    Short of breath when lying flat:    Irregular heart rhythm:        Vascular    Pain in calf, thigh, or hip brought on by ambulation:    Pain in feet at night that wakes you up from your sleep:     Blood clot in your veins:    Leg swelling:  x       Pulmonary    Oxygen at home:    Productive cough:     Wheezing:         Neurologic    Sudden weakness in arms or legs:     Sudden numbness in arms or legs:     Sudden onset of difficulty speaking or slurred speech:    Temporary loss of vision in one eye:     Problems with dizziness:         Gastrointestinal    Blood in stool:     Vomited blood:         Genitourinary    Burning when urinating:     Blood in urine:        Psychiatric    Major depression:         Hematologic    Bleeding problems:    Problems with blood clotting too easily:        Skin    Rashes or ulcers:         Constitutional    Fever or chills:      PHYSICAL EXAMINATION:  Today's Vitals   05/08/23 1236  BP: (!) 148/105  Pulse: 66  Temp: 98.3 F (36.8 C)  TempSrc: Temporal  SpO2: 98%  Weight: 283 lb (128.4 kg)  PainSc: 3    Body mass index is 40.61 kg/m.   General:  WDWN in NAD; vital signs documented above Gait: Not observed HENT: WNL, normocephalic Pulmonary: normal non-labored breathing without wheezing Cardiac: regular HR; without carotid bruits Abdomen: obese Skin: without rashes Vascular Exam/Pulses:  Right Left  Radial 2+ (normal) 2+ (normal)  DP 2+ (normal) 2+ (normal)   Extremities: BLE swelling with left worse than right.  He does have pitting from his ankle socks.  He has hemosiderin staining bilaterally.  -stemmer sign; +hemosiderin staining bilaterally Neurologic: A&O X 3;  moving all extremities equally Psychiatric:  The pt has Normal affect.   Non-Invasive Vascular Imaging:   Venous duplex on 04/23/2023: +--------------+---------+------+-----------+------------+--------+  LEFT         Reflux NoRefluxReflux TimeDiameter cmsComments                          Yes                                   +--------------+---------+------+-----------+------------+--------+  CFV          no                                              +--------------+---------+------+-----------+------------+--------+  FV mid        no                                              +--------------+---------+------+-----------+------------+--------+  Popliteal    no                                              +--------------+---------+------+-----------+------------+--------+  GSV at [facility]              yes    >500 ms      0.77              +--------------+---------+------+-----------+------------+--------+  GSV prox thigh          yes    >500 ms      0.41              +--------------+---------+------+-----------+------------+--------+  GSV  mid thigh no                            0.38              +--------------+---------+------+-----------+------------+--------+  GSV dist thighno                            0.34              +--------------+---------+------+-----------+------------+--------+  GSV at knee   no                            0.35              +--------------+---------+------+-----------+------------+--------+  GSV prox calf           yes    >500 ms      0.31              +--------------+---------+------+-----------+------------+--------+  GSV mid calf  no                            0.24              +--------------+---------+------+-----------+------------+--------+  SSV Pop Fossa no                            0.50              +--------------+---------+------+-----------+------------+--------+  SSV prox calf no                            0.40              +--------------+---------+------+-----------+------------+--------+  SSV mid calf  no                            0.33              +--------------+---------+------+-----------+------------+--------+  AASV O        no                            0.50              +--------------+---------+------+-----------+------------+--------+  AASV P                  yes    >500 ms      0.38              +--------------+---------+------+-----------+------------+--------+  AASV M        no                            0.30              +--------------+---------+------+-----------+------------+--------+   Summary:  Left:  - Venous reflux is noted in the left sapheno-femoral junction.  - Venous reflux is noted in the left greater saphenous vein in the thigh.  - Venous reflux is noted in the left greater saphenous vein in the calf.  - Reflux in the proximal AASV.     [name] is a 62 y.o. male who presents with: BLE swelling with left worse than right    -pt has easily palpable DP pedal pulses  bilaterally -LLE duplex reveals that pt does have venous reflux in the GSV at the [facility] and the proximal thigh and proximal calf as well as the anterior accessory vein. He did not have any reflux in the deep venous system.  He is not a candidate for laser ablation as the size of the vein is not adequate.   -he does have hemosiderin staining from his venous insufficiency. -discussed with pt about wearing knee high compression stockings with the sock covering the toes.  Encouraged him to put them on before getting out of bed in the morning to make them easier to put on and take off at night.   -discussed the importance of leg elevation and how to elevate properly - pt is advised to elevate their legs and a diagram is given to them to demonstrate for pt to lay flat on their back with knees elevated and slightly bent with their feet higher than their knees, which puts their feet higher than their heart for 15 minutes per day.  If pt cannot lay flat, advised to lay as flat as possible.  -pt is advised to continue as much walking as possible and avoid sitting or standing for long periods of time.  -discussed importance of weight loss and exercise and that water aerobics would also be beneficial.  He states prior to his wife's passing, they were going to sign up for Silver [employer]neakers.  Hopefully, he can become active with that program.  -handout with recommendations given -pt will f/u as needed.    Doreatha Massed, [facility] [phone]  Clinic MD:  Chestine Spore

## 2023-09-06 DIAGNOSIS — N281 Cyst of kidney, acquired: Secondary | ICD-10-CM | POA: Diagnosis not present

## 2023-09-06 DIAGNOSIS — Z1389 Encounter for screening for other disorder: Secondary | ICD-10-CM | POA: Diagnosis not present

## 2023-09-06 DIAGNOSIS — M519 Unspecified thoracic, thoracolumbar and lumbosacral intervertebral disc disorder: Secondary | ICD-10-CM | POA: Diagnosis not present

## 2023-09-06 DIAGNOSIS — I4510 Unspecified right bundle-branch block: Secondary | ICD-10-CM | POA: Diagnosis not present

## 2023-09-06 DIAGNOSIS — I872 Venous insufficiency (chronic) (peripheral): Secondary | ICD-10-CM | POA: Diagnosis not present

## 2023-09-06 DIAGNOSIS — N1831 Chronic kidney disease, stage 3a: Secondary | ICD-10-CM | POA: Diagnosis not present

## 2023-09-06 DIAGNOSIS — I10 Essential (primary) hypertension: Secondary | ICD-10-CM | POA: Diagnosis not present

## 2023-09-06 DIAGNOSIS — G4733 Obstructive sleep apnea (adult) (pediatric): Secondary | ICD-10-CM | POA: Diagnosis not present

## 2023-09-06 DIAGNOSIS — Z0000 Encounter for general adult medical examination without abnormal findings: Secondary | ICD-10-CM | POA: Diagnosis not present

## 2023-10-03 DIAGNOSIS — N281 Cyst of kidney, acquired: Secondary | ICD-10-CM | POA: Diagnosis not present

## 2023-10-31 DIAGNOSIS — I872 Venous insufficiency (chronic) (peripheral): Secondary | ICD-10-CM | POA: Diagnosis not present

## 2023-10-31 DIAGNOSIS — I10 Essential (primary) hypertension: Secondary | ICD-10-CM | POA: Diagnosis not present

## 2023-10-31 DIAGNOSIS — N1831 Chronic kidney disease, stage 3a: Secondary | ICD-10-CM | POA: Diagnosis not present

## 2023-12-17 DIAGNOSIS — I872 Venous insufficiency (chronic) (peripheral): Secondary | ICD-10-CM | POA: Diagnosis not present

## 2023-12-17 DIAGNOSIS — N1831 Chronic kidney disease, stage 3a: Secondary | ICD-10-CM | POA: Diagnosis not present

## 2023-12-17 DIAGNOSIS — I10 Essential (primary) hypertension: Secondary | ICD-10-CM | POA: Diagnosis not present

## 2024-02-04 DIAGNOSIS — N1831 Chronic kidney disease, stage 3a: Secondary | ICD-10-CM | POA: Diagnosis not present

## 2024-02-04 DIAGNOSIS — I10 Essential (primary) hypertension: Secondary | ICD-10-CM | POA: Diagnosis not present

## 2024-03-06 DIAGNOSIS — I10 Essential (primary) hypertension: Secondary | ICD-10-CM | POA: Diagnosis not present

## 2024-03-06 DIAGNOSIS — N1831 Chronic kidney disease, stage 3a: Secondary | ICD-10-CM | POA: Diagnosis not present

## 2024-04-17 DIAGNOSIS — N1831 Chronic kidney disease, stage 3a: Secondary | ICD-10-CM | POA: Diagnosis not present

## 2024-04-17 DIAGNOSIS — I10 Essential (primary) hypertension: Secondary | ICD-10-CM | POA: Diagnosis not present

## 2024-04-17 DIAGNOSIS — Z23 Encounter for immunization: Secondary | ICD-10-CM | POA: Diagnosis not present

## 2024-04-17 DIAGNOSIS — I872 Venous insufficiency (chronic) (peripheral): Secondary | ICD-10-CM | POA: Diagnosis not present

## 2024-06-20 ENCOUNTER — Ambulatory Visit: Attending: Cardiology | Admitting: Cardiology

## 2024-06-20 VITALS — BP 126/78 | HR 72 | Ht 68.0 in | Wt 276.2 lb

## 2024-06-20 DIAGNOSIS — N1831 Chronic kidney disease, stage 3a: Secondary | ICD-10-CM

## 2024-06-20 DIAGNOSIS — R072 Precordial pain: Secondary | ICD-10-CM

## 2024-06-20 DIAGNOSIS — I890 Lymphedema, not elsewhere classified: Secondary | ICD-10-CM

## 2024-06-20 DIAGNOSIS — Z01812 Encounter for preprocedural laboratory examination: Secondary | ICD-10-CM | POA: Diagnosis not present

## 2024-06-20 DIAGNOSIS — G4733 Obstructive sleep apnea (adult) (pediatric): Secondary | ICD-10-CM

## 2024-06-20 NOTE — Progress Notes (Signed)
 Cardiology Office Note:  .   Date:  06/20/2024  ID:  [name] Patterson, DOB [birth year], MRN [id number] PCP: Addie Camellia CROME, MD  [facility] Providers Cardiologist:  None     History of Present Illness: [name]   [name] is a 63 y.o. male Discussed the use of AI scribe   History of Present Illness [name] Patterson is a 63 year old male with chronic kidney disease stage 3A and peripheral artery disease who presents with shortness of breath and chest pain. He was referred by Dr. Sharie for evaluation of chest pain and shortness of breath.  He experiences episodes of sharp chest pain lasting about 20 minutes, occurring four to five times. The pain is sometimes associated with shortness of breath and can occur while sitting. No recent heartburn or indigestion. Aspirin 325 mg alleviates the pain. He describes the pain as feeling like 'needles or something sticking in the back of my heart.'  He has shortness of breath, particularly when climbing the 14 steps in his townhouse. He feels weak and sometimes drops to one knee to catch his breath.  He has a history of bilateral lower extremity swelling, with the left leg more affected than the right. He reports bilateral lower extremity swelling, with the left leg more affected than the right, and notes that his shoe size has increased from 10.5 to 13 wide due to swelling. He uses compression hose and takes furosemide 40 mg daily.  His past medical history includes obstructive sleep apnea, hypertension, morbid obesity, stasis dermatitis, peripheral vascular disease, and right bundle branch block. He has had four back surgeries, including a fusion in his lower back, which has affected his circulation and sensation in his feet since 2003.  He has chronic kidney disease stage 3A, with a creatinine level of 1.95 as of April 17, 2024. He drinks water regularly to maintain hydration.  He underwent an echocardiogram in 2019, which showed moderate left  ventricular hypertrophy, an ejection fraction of 65%, a mildly dilated right atrium, and grade 2 diastolic dysfunction. He also had a troponin test in the outpatient setting, which was 16 and considered normal.  He takes metoprolol 100 mg daily and aspirin 325 mg as part of his medication regimen. He has never smoked.      ROS: CP, SOB, leg swelling  Studies Reviewed: [name]   EKG Interpretation Date/Time:  Friday June 20 2024 08:31:15 EST Ventricular Rate:  69 PR Interval:  236 QRS Duration:  130 QT Interval:  404 QTC Calculation: 432 R Axis:   60  Text Interpretation: Sinus rhythm with 1st degree A-V block Right bundle branch block When compared with ECG of 28-Nov-2017 12:34, No significant change since last tracing Confirmed by Jeffrie Anes ([id number]) on 06/20/2024 8:33:02 AM    Results LABS Creatinine: 1.95 (04/17/2024) Hemoglobin A1c: 5.5 Hemoglobin: 17.3 LDL: 107 TSH: 2.5  DIAGNOSTIC Echocardiogram: Moderate left ventricular hypertrophy (LVH), ejection fraction (EF) 65%, mildly dilated right atrium, grade 2 diastolic dysfunction (2019) Risk Assessment/Calculations:            Physical Exam:   VS:  BP 126/78   Pulse 72   Ht 5' 8 (1.727 m)   Wt 276 lb 3.2 oz (125.3 kg)   SpO2 97%   BMI 42.00 kg/m    Wt Readings from Last 3 Encounters:  06/20/24 276 lb 3.2 oz (125.3 kg)  05/08/23 283 lb (128.4 kg)  05/27/19 269 lb (122 kg)    GEN:  Well nourished, well developed in no acute distress NECK: No JVD; No carotid bruits CARDIAC: RRR, no murmurs, no rubs, no gallops RESPIRATORY:  Clear to auscultation without rales, wheezing or rhonchi  ABDOMEN: Soft, non-tender, non-distended EXTREMITIES: Marked bilateral lower extremity edema; No deformity   ASSESSMENT AND PLAN: .    Assessment and Plan Assessment & Plan Chest pain and dyspnea evaluation Intermittent sharp chest pain lasting about 20 minutes, occurring 4-5 times, sometimes associated with dyspnea. Pain is  relieved by aspirin. Differential includes coronary artery disease given the nature of the pain and relief with aspirin. Previous troponin was normal. Echocardiogram in 2019 showed moderate LVH, EF 65%, mildly dilated right atrium, grade 2 diastolic dysfunction. - Ordered EKG - Ordered echocardiogram - Ordered coronary CT scan with hydration prior to contrast administration due to chronic kidney disease  Chronic kidney disease stage 3A Creatinine levels ranging from 1.7 to 1.95. Requires careful management of contrast use for imaging studies. - Ensure adequate hydration before coronary CT scan  Peripheral artery disease and lymphedema of lower extremities Bilateral lower extremity swelling, left greater than right, with significant lymphedema. Swelling has increased shoe size from 10.5 to 13 wide. Lymphedema likely contributing to circulation issues.  Hypertension Managed with metoprolol 100 mg daily. Amlodipine  5 mg   Morbid obesity BMI of 42. Weight management is crucial given comorbid conditions.  Continue to encourage exercise, Mediterranean diet  Obstructive sleep apnea Obstructive sleep apnea. Per PCP  Stasis dermatitis of lower extremities Stasis dermatitis likely related to chronic venous insufficiency and lymphedema. Lasix 40 mg. [facility] note reviewed  Right bundle branch block  Benign lipoma of right shoulder Benign lipoma on right shoulder, previously evaluated and determined to be benign.          Dispo: Will follow up with testing  Signed, Oneil Parchment, MD

## 2024-06-20 NOTE — Patient Instructions (Addendum)
 Medication Instructions:  The current medical regimen is effective;  continue present plan and medications.  *If you need a refill on your cardiac medications before your next appointment, please call your pharmacy*  Lab Work: Please have blood work today (BMP) had at PCP 11/13 - will have them send the results!!! If you have labs (blood work) drawn today and your tests are completely normal, you will receive your results only by: MyChart Message (if you have MyChart) OR A paper copy in the mail If you have any lab test that is abnormal or we need to change your treatment, we will call you to review the results.  Testing/Procedures: Your physician has requested that you have an echocardiogram. Echocardiography is a painless test that uses sound waves to create images of your heart. It provides your doctor with information about the size and shape of your heart and how well your heart's chambers and valves are working. This procedure takes approximately one hour. There are no restrictions for this procedure. Please do NOT wear cologne, perfume, aftershave, or lotions (deodorant is allowed). Please arrive 15 minutes prior to your appointment time.  Please note: We ask at that you not bring children with you during ultrasound (echo/ vascular) testing. Due to room size and safety concerns, children are not allowed in the ultrasound rooms during exams. Our front office staff cannot provide observation of children in our lobby area while testing is being conducted. An adult accompanying a patient to their appointment will only be allowed in the ultrasound room at the discretion of the ultrasound technician under special circumstances. We apologize for any inconvenience.    Your cardiac CT will be scheduled at:   [facility] [address]  [place], [place] [address]  Please enter the parking lot using the [address] entrance and use the FREE valet service at the  patient drop-off area. Enter the building and check-in with registration on the main floor.  Please follow these instructions carefully (unless otherwise directed):  An IV will be required for this test and Nitroglycerin will be given.  Hold all erectile dysfunction medications at least 3 days (72 hrs) prior to test. (Ie viagra, cialis, sildenafil, tadalafil, etc)   On the Night Before the Test: Be sure to Drink plenty of water. Do not consume any caffeinated/decaffeinated beverages or chocolate 12 hours prior to your test. Do not take any antihistamines 12 hours prior to your test.  On the Day of the Test: Drink plenty of water until 1 hour prior to the test. Do not eat any food 1 hour prior to test. You may take your regular medications prior to the test.  Take metoprolol (Lopressor) two hours prior to test. If you take Furosemide/Hydrochlorothiazide/Spironolactone/Chlorthalidone, please HOLD on the morning of the test. Patients who wear a continuous glucose monitor MUST remove the device prior to scanning.      After the Test: Drink plenty of water. After receiving IV contrast, you may experience a mild flushed feeling. This is normal. On occasion, you may experience a mild rash up to 24 hours after the test. This is not dangerous. If this occurs, you can take Benadryl 25 mg, Zyrtec, Claritin, or Allegra and increase your fluid intake. (Patients taking Tikosyn should avoid Benadryl, and may take Zyrtec, Claritin, or Allegra) If you experience trouble breathing, this can be serious. If it is severe call 911 IMMEDIATELY. If it is mild, please call our office.  We will call  to schedule your test 2-4 weeks out understanding that some insurance companies will need an authorization prior to the service being performed.   For more information and frequently asked questions, please visit our website : [email or web address]  For non-scheduling related questions, please contact the  cardiac imaging nurse navigator should you have any questions/concerns: Cardiac Imaging Nurse Navigators Direct Office Dial: [phone]   For scheduling needs, including cancellations and rescheduling, please call Brittany, [phone].   Follow-Up: At [facility], you and your health needs are our priority.  As part of our continuing mission to provide you with exceptional heart care, our providers are all part of one team.  This team includes your primary Cardiologist (physician) and Advanced Practice Providers or APPs (Physician Assistants and Nurse Practitioners) who all work together to provide you with the care you need, when you need it.  Your next appointment:   Follow up will be based on the results of the above testing.  We recommend signing up for the patient portal called MyChart.  Sign up information is provided on this After Visit Summary.  MyChart is used to connect with patients for Virtual Visits (Telemedicine).  Patients are able to view lab/test results, encounter notes, upcoming appointments, etc.  Non-urgent messages can be sent to your provider as well.   To learn more about what you can do with MyChart, go to [email or web address].

## 2024-06-30 ENCOUNTER — Telehealth (HOSPITAL_COMMUNITY): Payer: Self-pay | Admitting: *Deleted

## 2024-06-30 NOTE — Telephone Encounter (Signed)
Reaching out to patient to offer assistance regarding upcoming cardiac imaging study; pt verbalizes understanding of appt date/time, parking situation and where to check in, pre-test NPO status  and verified current allergies; name and call back number provided for further questions should they arise ? ?Adalaya Irion RN Navigator Cardiac Imaging ?[facility] ?[phone] office ?[phone] cell ? ?

## 2024-07-01 ENCOUNTER — Ambulatory Visit (HOSPITAL_BASED_OUTPATIENT_CLINIC_OR_DEPARTMENT_OTHER): Admission: RE | Admit: 2024-07-01 | Source: Ambulatory Visit

## 2024-07-01 ENCOUNTER — Encounter (HOSPITAL_BASED_OUTPATIENT_CLINIC_OR_DEPARTMENT_OTHER): Payer: Self-pay

## 2024-07-01 ENCOUNTER — Other Ambulatory Visit: Payer: Self-pay | Admitting: Vascular Surgery

## 2024-07-01 DIAGNOSIS — M7989 Other specified soft tissue disorders: Secondary | ICD-10-CM

## 2024-07-10 ENCOUNTER — Ambulatory Visit (HOSPITAL_BASED_OUTPATIENT_CLINIC_OR_DEPARTMENT_OTHER)

## 2024-08-13 ENCOUNTER — Ambulatory Visit

## 2024-08-13 ENCOUNTER — Encounter (HOSPITAL_COMMUNITY)
